# Patient Record
Sex: Female | Born: 1990 | Race: Black or African American | Hispanic: No | Marital: Single | Smoking: Never smoker
Health system: Southern US, Community
[De-identification: ages and names within clinical notes are randomized; demographics above are authoritative.]

## PROBLEM LIST (undated history)

## (undated) DIAGNOSIS — D219 Benign neoplasm of connective and other soft tissue, unspecified: Secondary | ICD-10-CM

## (undated) DIAGNOSIS — D649 Anemia, unspecified: Secondary | ICD-10-CM

## (undated) DIAGNOSIS — O99345 Other mental disorders complicating the puerperium: Secondary | ICD-10-CM

## (undated) DIAGNOSIS — F53 Postpartum depression: Secondary | ICD-10-CM

## (undated) DIAGNOSIS — Z789 Other specified health status: Secondary | ICD-10-CM

## (undated) HISTORY — PX: NO PAST SURGERIES: SHX2092

## (undated) HISTORY — DX: Postpartum depression: F53.0

## (undated) HISTORY — DX: Anemia, unspecified: D64.9

## (undated) HISTORY — DX: Other mental disorders complicating the puerperium: O99.345

## (undated) HISTORY — DX: Benign neoplasm of connective and other soft tissue, unspecified: D21.9

## (undated) HISTORY — PX: TOOTH EXTRACTION: SUR596

---
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
## 2009-04-22 ENCOUNTER — Emergency Department (HOSPITAL_COMMUNITY): Admission: EM | Admit: 2009-04-22 | Discharge: 2009-04-22 | Payer: Self-pay | Admitting: Emergency Medicine

## 2009-10-15 ENCOUNTER — Inpatient Hospital Stay (HOSPITAL_COMMUNITY): Admission: AD | Admit: 2009-10-15 | Discharge: 2009-10-15 | Payer: Self-pay | Admitting: Obstetrics & Gynecology

## 2009-10-15 ENCOUNTER — Encounter: Payer: Self-pay | Admitting: Family Medicine

## 2009-12-10 ENCOUNTER — Inpatient Hospital Stay (HOSPITAL_COMMUNITY): Admission: AD | Admit: 2009-12-10 | Discharge: 2009-12-10 | Payer: Self-pay | Admitting: Obstetrics & Gynecology

## 2010-01-13 ENCOUNTER — Ambulatory Visit (HOSPITAL_COMMUNITY): Admission: RE | Admit: 2010-01-13 | Discharge: 2010-01-13 | Payer: Self-pay | Admitting: Obstetrics & Gynecology

## 2010-02-10 ENCOUNTER — Inpatient Hospital Stay (HOSPITAL_COMMUNITY): Admission: AD | Admit: 2010-02-10 | Discharge: 2010-02-10 | Payer: Self-pay | Admitting: Obstetrics and Gynecology

## 2010-02-10 ENCOUNTER — Ambulatory Visit: Payer: Self-pay | Admitting: Advanced Practice Midwife

## 2010-09-26 NOTE — L&D Delivery Note (Signed)
Delivery Note At  a viable female was delivered via LOA (Presentation: ;  ).  APGAR:8+9, ; weight .   Placenta spont via shultz, .3vessel Cord:  Without complications.  Anesthesia:  Epidural Episiotomy: none Lacerations:none  Suture Repair: none Est. Blood Loss (mL):   Mom to postpartum.  Baby to nursery-stable.  Zerita Boers 05/22/2011, 3:09 PM

## 2010-12-08 ENCOUNTER — Inpatient Hospital Stay (HOSPITAL_COMMUNITY): Payer: Medicaid Other

## 2010-12-08 ENCOUNTER — Inpatient Hospital Stay (HOSPITAL_COMMUNITY)
Admission: AD | Admit: 2010-12-08 | Discharge: 2010-12-08 | Disposition: A | Discharge status: Home / Self Care | Payer: Medicaid Other | Source: Ambulatory Visit | Attending: Family Medicine | Admitting: Family Medicine

## 2010-12-08 DIAGNOSIS — R109 Unspecified abdominal pain: Secondary | ICD-10-CM

## 2010-12-08 DIAGNOSIS — O209 Hemorrhage in early pregnancy, unspecified: Secondary | ICD-10-CM

## 2010-12-08 LAB — CBC
HCT: 29.8 % — ABNORMAL LOW (ref 36.0–46.0)
Hemoglobin: 9.6 g/dL — ABNORMAL LOW (ref 12.0–15.0)
RBC: 4.23 MIL/uL (ref 3.87–5.11)
WBC: 5.1 10*3/uL (ref 4.0–10.5)

## 2010-12-08 LAB — URINALYSIS, ROUTINE W REFLEX MICROSCOPIC
Glucose, UA: NEGATIVE mg/dL
Specific Gravity, Urine: >1.03 — ABNORMAL HIGH (ref 1.005–1.030)
pH: 6.5 (ref 5.0–8.0)

## 2010-12-08 LAB — ABO/RH: ABO/RH(D): A POS

## 2010-12-08 LAB — HCG, QUANTITATIVE, PREGNANCY: hCG, Beta Chain, Quant, S: 30513 m[IU]/mL — ABNORMAL HIGH (ref <5)

## 2010-12-08 LAB — WET PREP, GENITAL
Clue Cells Wet Prep HPF POC: NONE SEEN
Trich, Wet Prep: NONE SEEN

## 2010-12-12 LAB — COMPREHENSIVE METABOLIC PANEL
ALT: 13 U/L (ref 0–35)
AST: 17 U/L (ref 0–37)
Albumin: 4 g/dL (ref 3.5–5.2)
Alkaline Phosphatase: 54 U/L (ref 39–117)
CO2: 24 mEq/L (ref 19–32)
Chloride: 102 mEq/L (ref 96–112)
Creatinine, Ser: 0.62 mg/dL (ref 0.4–1.2)
GFR calc Af Amer: >60 mL/min (ref >60)
GFR calc non Af Amer: >60 mL/min (ref >60)
Potassium: 4 mEq/L (ref 3.5–5.1)
Total Bilirubin: 0.5 mg/dL (ref 0.3–1.2)

## 2010-12-12 LAB — CBC
HCT: 34.8 % — ABNORMAL LOW (ref 36.0–46.0)
Hemoglobin: 11.1 g/dL — ABNORMAL LOW (ref 12.0–15.0)
MCHC: 32 g/dL (ref 30.0–36.0)
RBC: 4.77 MIL/uL (ref 3.87–5.11)
RDW: 15.2 % (ref 11.5–15.5)

## 2010-12-12 LAB — WET PREP, GENITAL: Yeast Wet Prep HPF POC: NONE SEEN

## 2010-12-12 LAB — POCT PREGNANCY, URINE: Preg Test, Ur: POSITIVE

## 2010-12-12 LAB — URINALYSIS, ROUTINE W REFLEX MICROSCOPIC
Glucose, UA: NEGATIVE mg/dL
Hgb urine dipstick: NEGATIVE
Ketones, ur: NEGATIVE mg/dL
Protein, ur: NEGATIVE mg/dL
pH: 6 (ref 5.0–8.0)

## 2010-12-13 LAB — URINE MICROSCOPIC-ADD ON

## 2010-12-13 LAB — URINALYSIS, ROUTINE W REFLEX MICROSCOPIC
Bilirubin Urine: NEGATIVE
Hgb urine dipstick: NEGATIVE
Specific Gravity, Urine: 1.025 (ref 1.005–1.030)
pH: 6.5 (ref 5.0–8.0)

## 2010-12-19 LAB — WET PREP, GENITAL

## 2010-12-19 LAB — URINALYSIS, ROUTINE W REFLEX MICROSCOPIC
Nitrite: NEGATIVE
Specific Gravity, Urine: 1.015 (ref 1.005–1.030)
Urobilinogen, UA: 0.2 mg/dL (ref 0.0–1.0)

## 2010-12-19 LAB — GC/CHLAMYDIA PROBE AMP, GENITAL
Chlamydia, DNA Probe: NEGATIVE
GC Probe Amp, Genital: NEGATIVE

## 2011-01-07 ENCOUNTER — Inpatient Hospital Stay (HOSPITAL_COMMUNITY)
Admission: AD | Admit: 2011-01-07 | Discharge: 2011-01-07 | Disposition: A | Discharge status: Home / Self Care | Payer: Medicaid Other | Source: Ambulatory Visit | Attending: Obstetrics & Gynecology | Admitting: Obstetrics & Gynecology

## 2011-01-07 ENCOUNTER — Inpatient Hospital Stay (HOSPITAL_COMMUNITY): Payer: Medicaid Other

## 2011-01-07 DIAGNOSIS — O99891 Other specified diseases and conditions complicating pregnancy: Secondary | ICD-10-CM | POA: Insufficient documentation

## 2011-01-07 DIAGNOSIS — R51 Headache: Secondary | ICD-10-CM | POA: Insufficient documentation

## 2011-01-07 DIAGNOSIS — O9989 Other specified diseases and conditions complicating pregnancy, childbirth and the puerperium: Secondary | ICD-10-CM

## 2011-01-17 ENCOUNTER — Inpatient Hospital Stay (HOSPITAL_COMMUNITY)
Admission: AD | Admit: 2011-01-17 | Discharge: 2011-01-18 | Disposition: A | Discharge status: Home / Self Care | Payer: Medicaid Other | Source: Ambulatory Visit | Attending: Family Medicine | Admitting: Family Medicine

## 2011-01-17 DIAGNOSIS — O4410 Placenta previa with hemorrhage, unspecified trimester: Secondary | ICD-10-CM | POA: Insufficient documentation

## 2011-01-18 ENCOUNTER — Inpatient Hospital Stay (HOSPITAL_COMMUNITY): Payer: Medicaid Other

## 2011-01-20 DIAGNOSIS — O4400 Placenta previa specified as without hemorrhage, unspecified trimester: Secondary | ICD-10-CM

## 2011-01-20 DIAGNOSIS — Z331 Pregnant state, incidental: Secondary | ICD-10-CM

## 2011-01-20 LAB — HEPATITIS B SURFACE ANTIGEN: Hepatitis B Surface Ag: NEGATIVE

## 2011-01-20 LAB — HIV ANTIBODY (ROUTINE TESTING W REFLEX): HIV: NONREACTIVE

## 2011-01-20 LAB — ANTIBODY SCREEN: Antibody Screen: NEGATIVE

## 2011-02-05 IMAGING — US US OB COMP LESS 14 WK
1 series · 14 of 28 positions shown · non-contrast
Comparison: none

OBSTETRICAL ULTRASOUND:
 This ultrasound exam was performed in the [HOSPITAL] Ultrasound Department.  The OB US report was generated in the AS system, and faxed to the ordering physician.  This report is also available in [HOSPITAL]?s Access[employer] and in [REDACTED] PACS.

[Series 1: us ob comp less 14 wks · 0.21mm/px · 29 acquisitions, 14 frames shown]
[im 2/29]
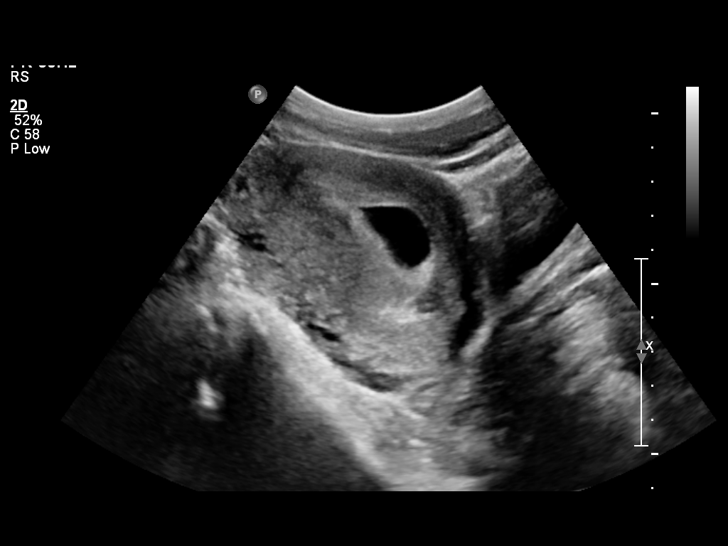
[im 4/29]
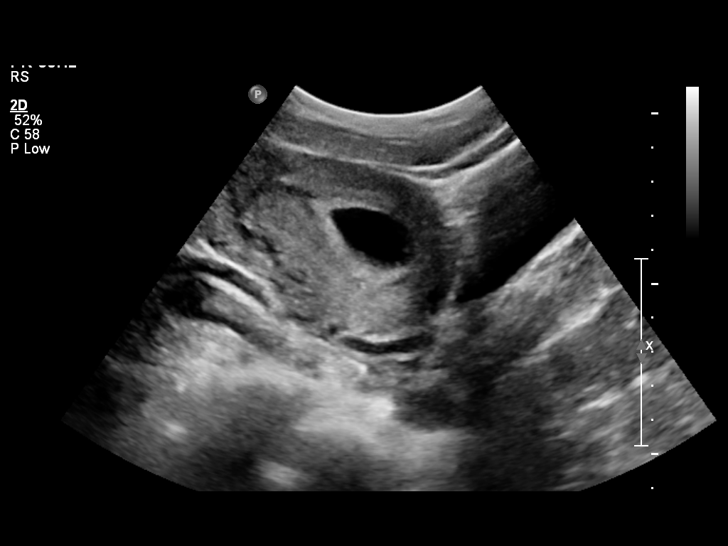
[im 6/29]
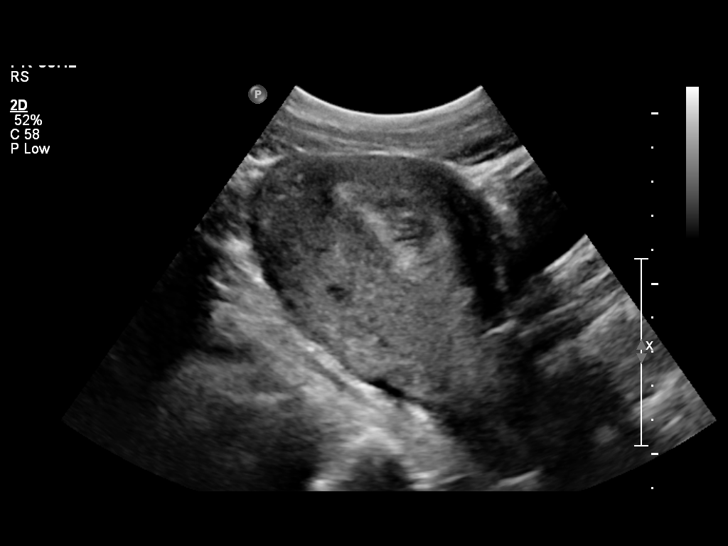
[im 8/29]
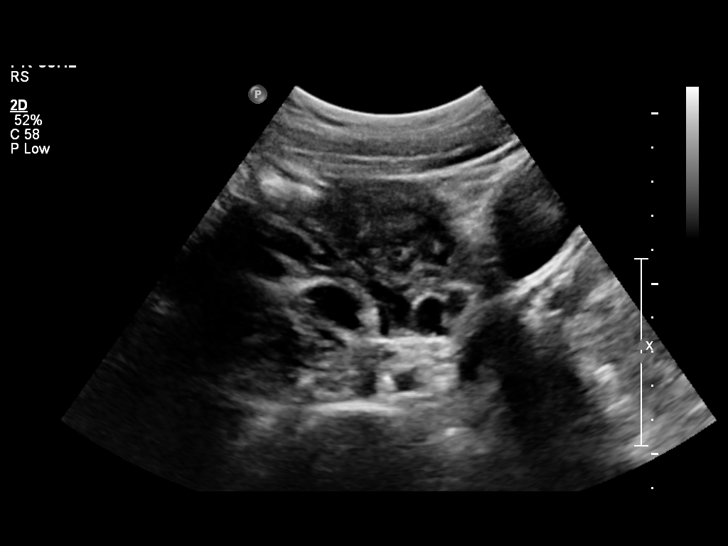
[im 10/29]
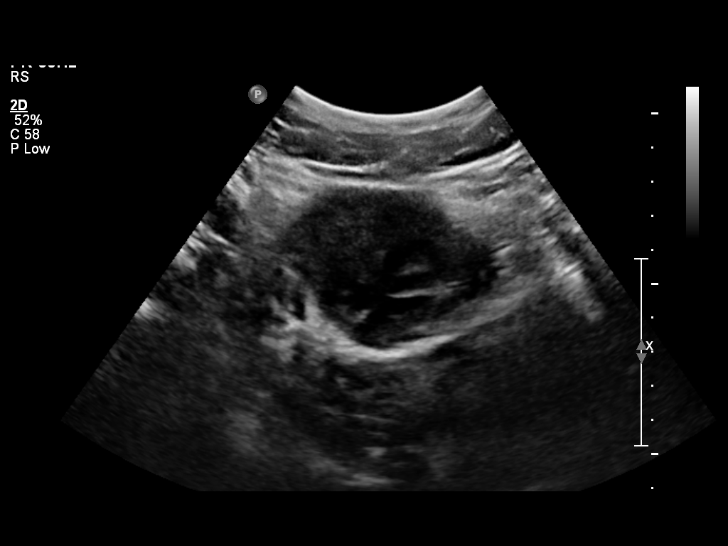
[im 12/29]
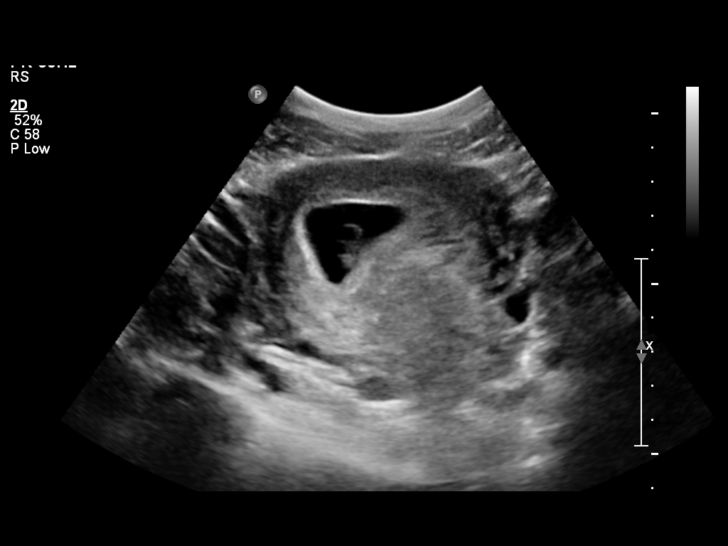
[im 14/29]
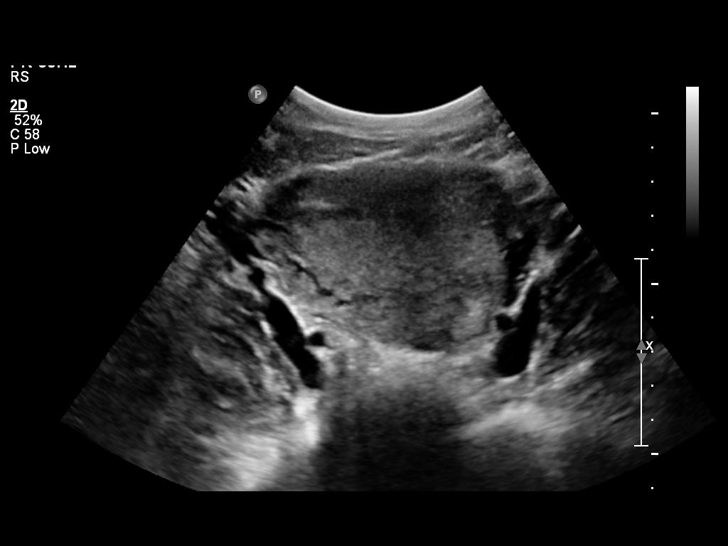
[im 16/29]
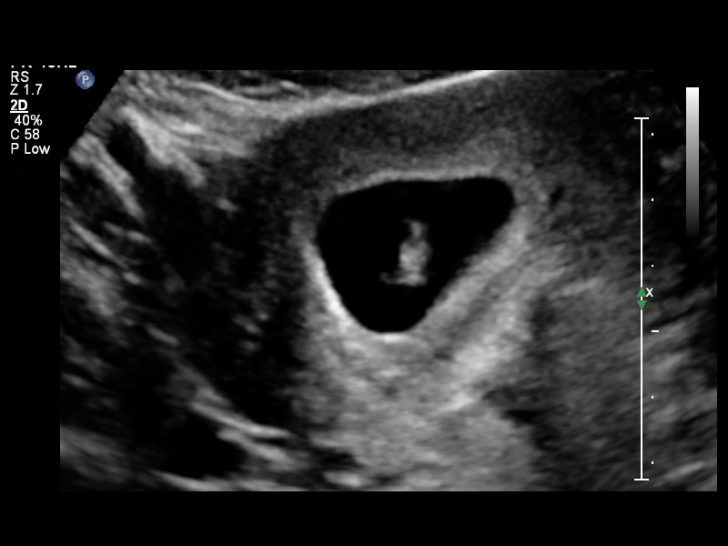
[im 18/29]
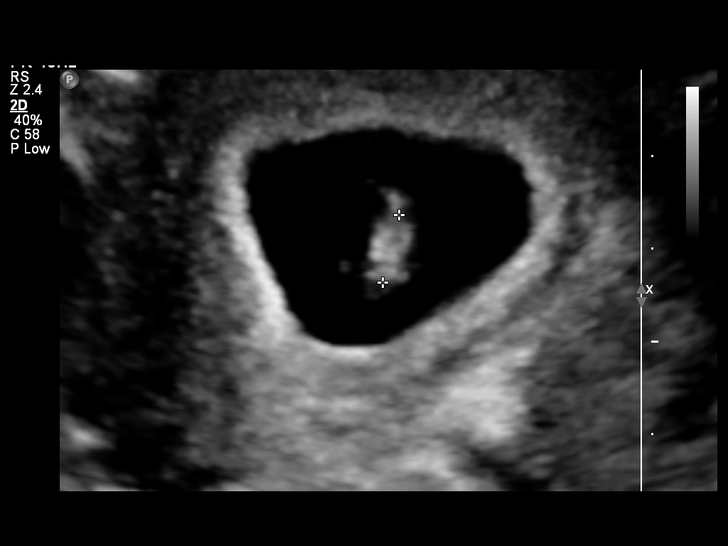
[im 20/29]
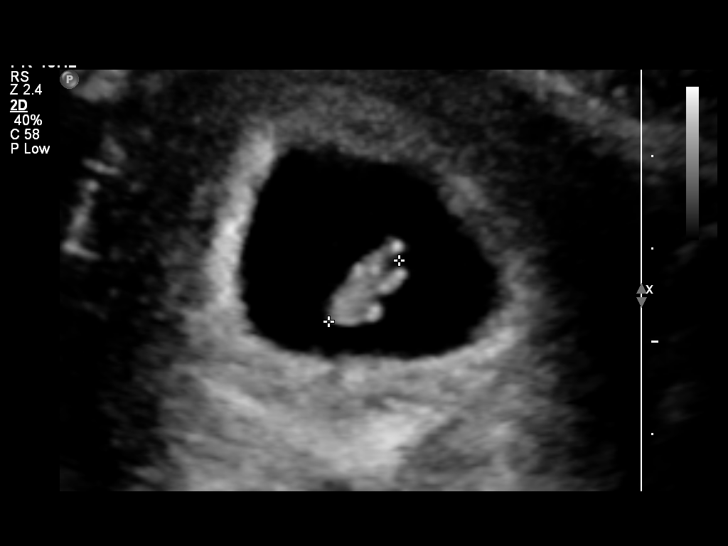
[im 22/29]
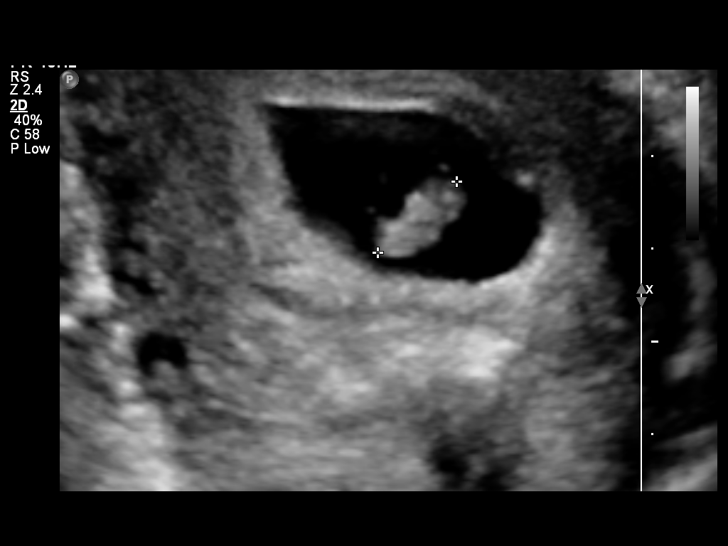
[im 24/29]
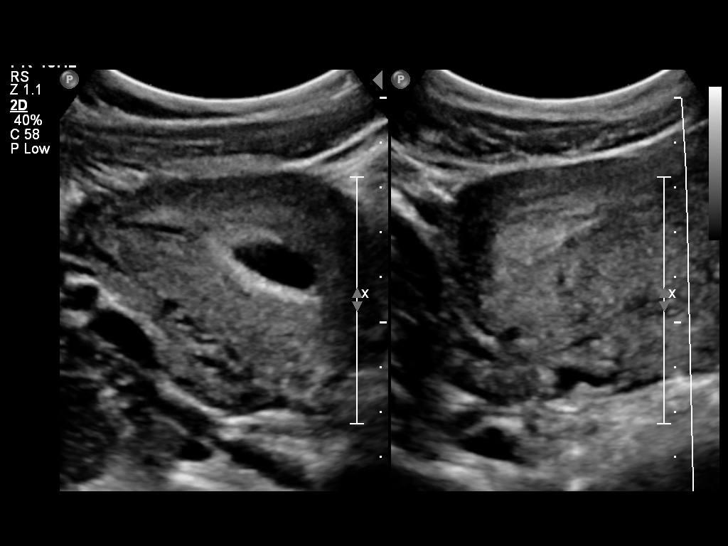
[im 26/29]
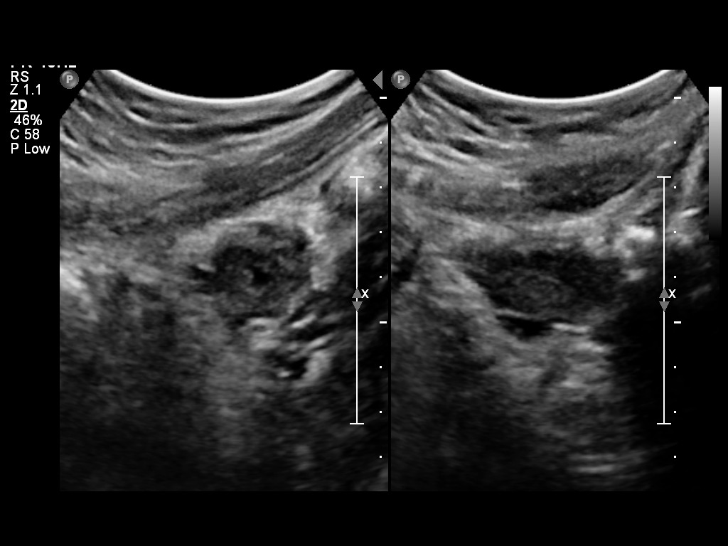
[im 29/29]
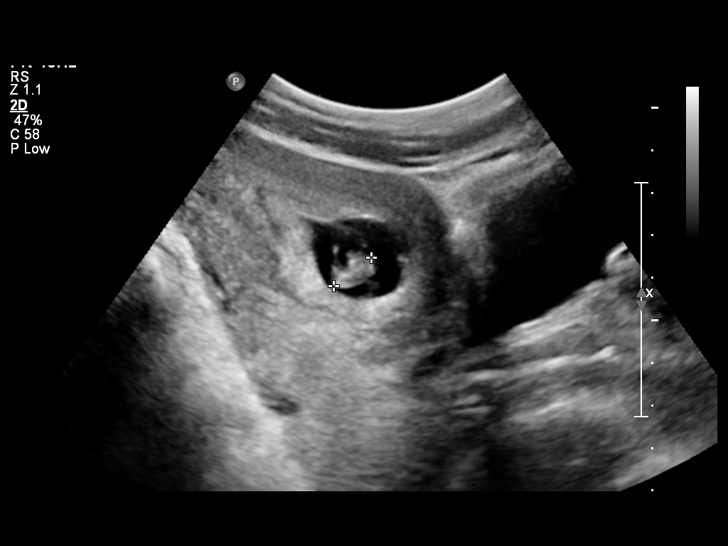

[14 of 28 positions shown; findings below may reference images not displayed]

IMPRESSION: See AS Obstetric US report.

## 2011-02-10 ENCOUNTER — Other Ambulatory Visit: Payer: Self-pay | Admitting: Obstetrics & Gynecology

## 2011-02-10 DIAGNOSIS — O4400 Placenta previa specified as without hemorrhage, unspecified trimester: Secondary | ICD-10-CM

## 2011-02-10 DIAGNOSIS — Z331 Pregnant state, incidental: Secondary | ICD-10-CM

## 2011-02-10 LAB — POCT URINALYSIS DIP (DEVICE)
Bilirubin Urine: NEGATIVE
Glucose, UA: NEGATIVE mg/dL
Hgb urine dipstick: NEGATIVE
Nitrite: NEGATIVE
Specific Gravity, Urine: 1.025 (ref 1.005–1.030)

## 2011-03-17 ENCOUNTER — Other Ambulatory Visit: Payer: Self-pay | Admitting: Family Medicine

## 2011-03-17 ENCOUNTER — Ambulatory Visit (HOSPITAL_COMMUNITY)
Admission: RE | Admit: 2011-03-17 | Discharge: 2011-03-17 | Disposition: A | Discharge status: Home / Self Care | Payer: Medicaid Other | Source: Ambulatory Visit | Attending: Obstetrics & Gynecology | Admitting: Obstetrics & Gynecology

## 2011-03-17 ENCOUNTER — Other Ambulatory Visit: Payer: Self-pay | Admitting: Obstetrics & Gynecology

## 2011-03-17 DIAGNOSIS — O4400 Placenta previa specified as without hemorrhage, unspecified trimester: Secondary | ICD-10-CM

## 2011-03-17 DIAGNOSIS — Z3689 Encounter for other specified antenatal screening: Secondary | ICD-10-CM | POA: Insufficient documentation

## 2011-03-17 LAB — POCT URINALYSIS DIP (DEVICE)
Bilirubin Urine: NEGATIVE
Glucose, UA: NEGATIVE mg/dL
Ketones, ur: NEGATIVE mg/dL
pH: 7 (ref 5.0–8.0)

## 2011-03-31 ENCOUNTER — Other Ambulatory Visit: Payer: Self-pay | Admitting: Obstetrics and Gynecology

## 2011-03-31 DIAGNOSIS — O99019 Anemia complicating pregnancy, unspecified trimester: Secondary | ICD-10-CM

## 2011-03-31 LAB — POCT URINALYSIS DIP (DEVICE)
Bilirubin Urine: NEGATIVE
Glucose, UA: NEGATIVE mg/dL
Hgb urine dipstick: NEGATIVE
Ketones, ur: NEGATIVE mg/dL
Leukocytes, UA: NEGATIVE
Nitrite: NEGATIVE
Protein, ur: NEGATIVE mg/dL
Specific Gravity, Urine: 1.025 (ref 1.005–1.030)
Urobilinogen, UA: 1 mg/dL (ref 0.0–1.0)
pH: 7 (ref 5.0–8.0)

## 2011-04-20 DIAGNOSIS — O99019 Anemia complicating pregnancy, unspecified trimester: Secondary | ICD-10-CM

## 2011-04-20 DIAGNOSIS — O4400 Placenta previa specified as without hemorrhage, unspecified trimester: Secondary | ICD-10-CM

## 2011-05-04 ENCOUNTER — Other Ambulatory Visit: Payer: Self-pay | Admitting: Obstetrics and Gynecology

## 2011-05-04 LAB — POCT URINALYSIS DIP (DEVICE)
Bilirubin Urine: NEGATIVE
Glucose, UA: NEGATIVE mg/dL
Hgb urine dipstick: NEGATIVE
Ketones, ur: NEGATIVE mg/dL
Specific Gravity, Urine: 1.02 (ref 1.005–1.030)
Urobilinogen, UA: 1 mg/dL (ref 0.0–1.0)

## 2011-05-05 LAB — GC/CHLAMYDIA PROBE AMP, GENITAL
Chlamydia, DNA Probe: NEGATIVE
GC Probe Amp, Genital: NEGATIVE

## 2011-05-06 LAB — STREP B DNA PROBE: GBSP: POSITIVE

## 2011-05-11 ENCOUNTER — Other Ambulatory Visit: Payer: Self-pay | Admitting: Family Medicine

## 2011-05-11 DIAGNOSIS — Z3480 Encounter for supervision of other normal pregnancy, unspecified trimester: Secondary | ICD-10-CM

## 2011-05-11 LAB — POCT URINALYSIS DIP (DEVICE)
Bilirubin Urine: NEGATIVE
Glucose, UA: NEGATIVE mg/dL
Ketones, ur: NEGATIVE mg/dL
Leukocytes, UA: NEGATIVE
Nitrite: NEGATIVE

## 2011-05-18 ENCOUNTER — Ambulatory Visit (INDEPENDENT_AMBULATORY_CARE_PROVIDER_SITE_OTHER): Payer: Medicaid Other | Admitting: Obstetrics and Gynecology

## 2011-05-18 DIAGNOSIS — O99019 Anemia complicating pregnancy, unspecified trimester: Secondary | ICD-10-CM

## 2011-05-18 DIAGNOSIS — Z3480 Encounter for supervision of other normal pregnancy, unspecified trimester: Secondary | ICD-10-CM

## 2011-05-18 DIAGNOSIS — O2390 Unspecified genitourinary tract infection in pregnancy, unspecified trimester: Secondary | ICD-10-CM

## 2011-05-18 LAB — POCT URINALYSIS DIP (DEVICE)
Bilirubin Urine: NEGATIVE
Glucose, UA: NEGATIVE mg/dL
Ketones, ur: NEGATIVE mg/dL
Leukocytes, UA: NEGATIVE
Specific Gravity, Urine: 1.02 (ref 1.005–1.030)

## 2011-05-22 ENCOUNTER — Inpatient Hospital Stay (HOSPITAL_COMMUNITY)
Admission: AD | Admit: 2011-05-22 | Discharge: 2011-05-24 | DRG: 775 | Disposition: A | Discharge status: Home / Self Care | Payer: Medicaid Other | Source: Ambulatory Visit | Attending: Obstetrics and Gynecology | Admitting: Obstetrics and Gynecology

## 2011-05-22 ENCOUNTER — Encounter (HOSPITAL_COMMUNITY): Payer: Self-pay | Admitting: Anesthesiology

## 2011-05-22 ENCOUNTER — Inpatient Hospital Stay (HOSPITAL_COMMUNITY): Payer: Medicaid Other | Admitting: Anesthesiology

## 2011-05-22 ENCOUNTER — Encounter (HOSPITAL_COMMUNITY): Payer: Self-pay

## 2011-05-22 DIAGNOSIS — Z22330 Carrier of Group B streptococcus: Secondary | ICD-10-CM

## 2011-05-22 DIAGNOSIS — O99892 Other specified diseases and conditions complicating childbirth: Principal | ICD-10-CM | POA: Diagnosis present

## 2011-05-22 DIAGNOSIS — O9989 Other specified diseases and conditions complicating pregnancy, childbirth and the puerperium: Secondary | ICD-10-CM

## 2011-05-22 HISTORY — DX: Other specified health status: Z78.9

## 2011-05-22 LAB — CBC
HCT: 29.9 % — ABNORMAL LOW (ref 36.0–46.0)
Hemoglobin: 9.7 g/dL — ABNORMAL LOW (ref 12.0–15.0)
MCH: 22.6 pg — ABNORMAL LOW (ref 26.0–34.0)
MCV: 69.7 fL — ABNORMAL LOW (ref 78.0–100.0)
RBC: 4.29 MIL/uL (ref 3.87–5.11)

## 2011-05-22 MED ORDER — WITCH HAZEL-GLYCERIN EX PADS: 1.0000 "application " | MEDICATED_PAD | CUTANEOUS | Status: DC | PRN

## 2011-05-22 MED ORDER — DIPHENHYDRAMINE HCL 50 MG/ML IJ SOLN: 12.5000 mg | INTRAMUSCULAR | Status: DC | PRN

## 2011-05-22 MED ORDER — CITRIC ACID-SODIUM CITRATE 334-500 MG/5ML PO SOLN: 30.0000 mL | ORAL | Status: DC | PRN

## 2011-05-22 MED ORDER — OXYTOCIN BOLUS FROM INFUSION
500.0000 mL | Freq: Once | INTRAVENOUS | Status: DC
  Filled 2011-05-22: qty 500

## 2011-05-22 MED ORDER — TETANUS-DIPHTH-ACELL PERTUSSIS 5-2.5-18.5 LF-MCG/0.5 IM SUSP
0.5000 mL | Freq: Once | INTRAMUSCULAR | Status: AC
  Administered 2011-05-23: 0.5 mL via INTRAMUSCULAR
  Filled 2011-05-22: qty 0.5

## 2011-05-22 MED ORDER — LACTATED RINGERS IV SOLN
500.0000 mL | INTRAVENOUS | Status: DC | PRN
  Administered 2011-05-22: 500 mL via INTRAVENOUS

## 2011-05-22 MED ORDER — OXYTOCIN 20 UNITS IN LACTATED RINGERS INFUSION - SIMPLE
1.0000 m[IU]/min | INTRAVENOUS | Status: DC
  Administered 2011-05-22: 2 m[IU]/min via INTRAVENOUS
  Administered 2011-05-22: 4 m[IU]/min via INTRAVENOUS
  Administered 2011-05-22: 6 m[IU]/min via INTRAVENOUS

## 2011-05-22 MED ORDER — FENTANYL 2.5 MCG/ML BUPIVACAINE 1/10 % EPIDURAL INFUSION (WH - ANES)
14.0000 mL/h | INTRAMUSCULAR | Status: DC
  Administered 2011-05-22: 14 mL/h via EPIDURAL
  Filled 2011-05-22: qty 60

## 2011-05-22 MED ORDER — BENZOCAINE-MENTHOL 20-0.5 % EX AERO: 1.0000 "application " | INHALATION_SPRAY | CUTANEOUS | Status: DC | PRN

## 2011-05-22 MED ORDER — LACTATED RINGERS IV SOLN: 500.0000 mL | Freq: Once | INTRAVENOUS | Status: DC

## 2011-05-22 MED ORDER — PHENYLEPHRINE 40 MCG/ML (10ML) SYRINGE FOR IV PUSH (FOR BLOOD PRESSURE SUPPORT)
80.0000 ug | PREFILLED_SYRINGE | INTRAVENOUS | Status: DC | PRN
  Filled 2011-05-22 (×2): qty 5

## 2011-05-22 MED ORDER — ONDANSETRON HCL 4 MG PO TABS: 4.0000 mg | ORAL_TABLET | ORAL | Status: DC | PRN

## 2011-05-22 MED ORDER — PRENATAL PLUS 27-1 MG PO TABS
1.0000 | ORAL_TABLET | Freq: Every day | ORAL | Status: DC
  Administered 2011-05-23 – 2011-05-24 (×2): 1 via ORAL
  Filled 2011-05-22 (×2): qty 1

## 2011-05-22 MED ORDER — ONDANSETRON HCL 4 MG/2ML IJ SOLN: 4.0000 mg | Freq: Four times a day (QID) | INTRAMUSCULAR | Status: DC | PRN

## 2011-05-22 MED ORDER — PHENYLEPHRINE 40 MCG/ML (10ML) SYRINGE FOR IV PUSH (FOR BLOOD PRESSURE SUPPORT)
80.0000 ug | PREFILLED_SYRINGE | INTRAVENOUS | Status: DC | PRN
  Filled 2011-05-22: qty 5

## 2011-05-22 MED ORDER — IBUPROFEN 600 MG PO TABS: 600.0000 mg | ORAL_TABLET | Freq: Four times a day (QID) | ORAL | Status: DC | PRN

## 2011-05-22 MED ORDER — SIMETHICONE 80 MG PO CHEW: 80.0000 mg | CHEWABLE_TABLET | ORAL | Status: DC | PRN

## 2011-05-22 MED ORDER — TERBUTALINE SULFATE 1 MG/ML IJ SOLN: 0.2500 mg | Freq: Once | INTRAMUSCULAR | Status: DC | PRN

## 2011-05-22 MED ORDER — LACTATED RINGERS IV SOLN
INTRAVENOUS | Status: DC
  Administered 2011-05-22: 125 mL/h via INTRAVENOUS

## 2011-05-22 MED ORDER — LIDOCAINE HCL (PF) 1 % IJ SOLN: 30.0000 mL | INTRAMUSCULAR | Status: DC | PRN

## 2011-05-22 MED ORDER — SENNOSIDES-DOCUSATE SODIUM 8.6-50 MG PO TABS
2.0000 | ORAL_TABLET | Freq: Every day | ORAL | Status: DC
  Administered 2011-05-22 – 2011-05-23 (×2): 2 via ORAL

## 2011-05-22 MED ORDER — EPHEDRINE 5 MG/ML INJ
10.0000 mg | INTRAVENOUS | Status: DC | PRN
  Filled 2011-05-22 (×2): qty 4

## 2011-05-22 MED ORDER — OXYCODONE-ACETAMINOPHEN 5-325 MG PO TABS
1.0000 | ORAL_TABLET | ORAL | Status: DC | PRN
  Administered 2011-05-23 (×4): 1 via ORAL
  Filled 2011-05-22 (×4): qty 1

## 2011-05-22 MED ORDER — DIBUCAINE 1 % RE OINT: 1.0000 "application " | TOPICAL_OINTMENT | RECTAL | Status: DC | PRN

## 2011-05-22 MED ORDER — SODIUM CHLORIDE 0.9 % IV SOLN
2.0000 g | Freq: Four times a day (QID) | INTRAVENOUS | Status: DC
  Filled 2011-05-22 (×2): qty 2000

## 2011-05-22 MED ORDER — OXYCODONE-ACETAMINOPHEN 5-325 MG PO TABS: 2.0000 | ORAL_TABLET | ORAL | Status: DC | PRN

## 2011-05-22 MED ORDER — IBUPROFEN 600 MG PO TABS
600.0000 mg | ORAL_TABLET | Freq: Four times a day (QID) | ORAL | Status: DC
  Administered 2011-05-22 – 2011-05-24 (×8): 600 mg via ORAL
  Filled 2011-05-22 (×8): qty 1

## 2011-05-22 MED ORDER — DIPHENHYDRAMINE HCL 25 MG PO CAPS: 25.0000 mg | ORAL_CAPSULE | Freq: Four times a day (QID) | ORAL | Status: DC | PRN

## 2011-05-22 MED ORDER — OXYTOCIN 20 UNITS IN LACTATED RINGERS INFUSION - SIMPLE
125.0000 mL/h | INTRAVENOUS | Status: AC
  Administered 2011-05-22: 125 mL/h via INTRAVENOUS
  Filled 2011-05-22: qty 1000

## 2011-05-22 MED ORDER — ZOLPIDEM TARTRATE 5 MG PO TABS: 5.0000 mg | ORAL_TABLET | Freq: Every evening | ORAL | Status: DC | PRN

## 2011-05-22 MED ORDER — SODIUM CHLORIDE 0.9 % IV SOLN
2.0000 g | Freq: Once | INTRAVENOUS | Status: DC
  Administered 2011-05-22: 2 g via INTRAVENOUS
  Filled 2011-05-22: qty 2000

## 2011-05-22 MED ORDER — LANOLIN HYDROUS EX OINT: TOPICAL_OINTMENT | CUTANEOUS | Status: DC | PRN

## 2011-05-22 MED ORDER — ACETAMINOPHEN 325 MG PO TABS: 650.0000 mg | ORAL_TABLET | ORAL | Status: DC | PRN

## 2011-05-22 MED ORDER — FLEET ENEMA 7-19 GM/118ML RE ENEM: 1.0000 | ENEMA | RECTAL | Status: DC | PRN

## 2011-05-22 MED ORDER — EPHEDRINE 5 MG/ML INJ
10.0000 mg | INTRAVENOUS | Status: DC | PRN
  Filled 2011-05-22: qty 4

## 2011-05-22 MED ORDER — ONDANSETRON HCL 4 MG/2ML IJ SOLN: 4.0000 mg | INTRAMUSCULAR | Status: DC | PRN

## 2011-05-22 NOTE — Progress Notes (Signed)
Spontaneous vaginal delivery of viable female at 1458 apgars 8 and 9 see delivery room

## 2011-05-22 NOTE — Progress Notes (Signed)
Attempted to get pt up with steady pt unable to stand but can move legs. Pericare given after scan and clean pad and ice pack applied

## 2011-05-22 NOTE — Progress Notes (Signed)
Sheila Dudley is a 20 y.o. G3P2001 at [redacted]w[redacted]d by ultrasound admitted for rupture of membranes  Subjective:   Objective: BP 109/68  Pulse 78  Temp(Src) 98.1 F (36.7 C) (Oral)  Resp 18  Ht 5\' 5"  (1.651 m)  Wt 79.833 kg (176 lb)  BMI 29.29 kg/m2      FHT:  FHR: 120's bpm, variability: moderate,  accelerations:  Present,  decelerations:  Absent UC:   regular, every 2-4 minutes SVE:   Dilation: 2 Effacement (%): 60 Station: -2 Exam by:: K.Wilson,RN  Labs: Lab Results  Component Value Date   WBC 5.8 05/22/2011   HGB 9.7* 05/22/2011   HCT 29.9* 05/22/2011   MCV 69.7* 05/22/2011   PLT 250 05/22/2011    Assessment / Plan: Augmentation of labor, progressing well  Labor: Progressing normally Preeclampsia:  no signs or symptoms of toxicity Fetal Wellbeing:  Category I Pain Control:  Labor support without medications I/D:   Anticipated MOD:  NSVD  Zerita Boers 05/22/2011, 1:34 PM

## 2011-05-22 NOTE — Anesthesia Procedure Notes (Signed)
Epidural Patient location during procedure: OB Start time: 05/22/2011 12:37 PM  Staffing Anesthesiologist: Jiles Garter  Preanesthetic Checklist Completed: patient identified, site marked, surgical consent, pre-op evaluation, timeout performed, IV checked, risks and benefits discussed and monitors and equipment checked  Epidural Patient position: sitting Prep: site prepped and draped and DuraPrep Patient monitoring: continuous pulse ox and blood pressure Approach: midline Injection technique: LOR air  Needle:  Needle type: Tuohy  Needle gauge: 17 G Needle length: 9 cm Needle insertion depth: 4 cm Catheter type: closed end flexible Catheter size: 19 Gauge Catheter at skin depth: 10 cm Test dose: negative  Assessment Events: blood not aspirated, injection not painful, no injection resistance, negative IV test and no paresthesia  Additional Notes Dosing of Epidural: 1st dose, Through needle...... 5mg  Marcaine 2nd dose, through catheter.... epi 1:200K + Xylocaine 40 mg 3rd dose, through catheter...Marland KitchenMarland Kitchenepi 1:200K + Xylocaine 60 mg Each dose occurred after waiting 3 min,patient was free of IV sx; and patient exhibits no evidence of SA injection  Patient is more comfortable after epidural dosed. Please see RN's note for documentation of vital signs,and FHR which are stable.

## 2011-05-22 NOTE — Anesthesia Preprocedure Evaluation (Signed)

## 2011-05-22 NOTE — H&P (Signed)
Sheila Dudley is a 20 y.o. female presenting for SROM. Maternal Medical History:  Reason for admission: Reason for admission: rupture of membranes.  Contractions: Onset was 1-2 hours ago.   Frequency: rare.   Perceived severity is mild.    Fetal activity: Perceived fetal activity is normal.   Last perceived fetal movement was within the past hour.      OB History    Grav Para Term Preterm Abortions TAB SAB Ect Mult Living   3 2 2  0 0 0 0 0 0 1     Past Medical History  Diagnosis Date  . No pertinent past medical history    Past Surgical History  Procedure Date  . No past surgeries    Family History: family history is not on file. Social History:  reports that she has never smoked. She does not have any smokeless tobacco history on file. She reports that she does not drink alcohol or use illicit drugs.  Review of Systems  Constitutional: Negative.   HENT: Negative.   Eyes: Negative.   Respiratory: Negative.   Cardiovascular: Negative.   Gastrointestinal: Negative.   Genitourinary: Negative.   Musculoskeletal: Negative.   Skin: Negative.   Neurological: Positive for dizziness.  Endo/Heme/Allergies: Negative.   Psychiatric/Behavioral: Negative.     Dilation: 2 Effacement (%): 60 Station: -2 Exam by:: K.Wilson,RN Blood pressure 119/81, pulse 84, temperature 98.4 F (36.9 C), temperature source Oral, resp. rate 16, height 5\' 5"  (1.651 m), weight 79.742 kg (175 lb 12.8 oz). Maternal Exam:  Uterine Assessment: Contraction strength is mild.  Contraction frequency is rare.   Abdomen: Patient reports no abdominal tenderness. Estimated fetal weight is 7-8 lbs.   Fetal presentation: vertex  Introitus: Normal vulva.   Physical Exam  Constitutional: She is oriented to person, place, and time. She appears well-developed and well-nourished.  HENT:  Head: Normocephalic.  Neck: Normal range of motion.  Cardiovascular: Normal rate, regular rhythm and normal heart sounds.    Respiratory: Effort normal and breath sounds normal.  GI: Soft.       nontender and gravid  Genitourinary: Vagina normal.  Musculoskeletal: Normal range of motion.  Neurological: She is alert and oriented to person, place, and time. She has normal reflexes.  Skin: Skin is warm and dry.  Psychiatric: She has a normal mood and affect. Her behavior is normal. Judgment and thought content normal.    Prenatal labs: ABO, Rh: A/Positive/-- (04/26 0000) Antibody: Negative (04/26 0000) Rubella:   RPR: Nonreactive (04/26 0000)  HBsAg: Negative (04/26 0000)  HIV: Non-reactive (04/26 0000)  GBS: POSITIVE (08/08 1302)   Assessment/Plan: Pos GBS with SROM. Will admit and start pit aug to facilitate labor after iv antibiotics. Stable maternal fetal unit.   Zerita Boers 05/22/2011, 9:47 AM

## 2011-05-22 NOTE — Progress Notes (Signed)
Patient presents with c/o when getting out of car had to urinate really badly felt leaking, felt leaking in toilet around 8:00 a.m., feels like still is leaking, EDC 9/10 G3P2

## 2011-05-22 NOTE — Progress Notes (Signed)
EFM removed for epidural proceedure

## 2011-05-22 NOTE — Progress Notes (Signed)
EFM applied.

## 2011-05-23 NOTE — Progress Notes (Signed)
Post Partum Day 1  Subjective: no complaints, up ad lib, voiding, tolerating PO and + flatus  Objective: Blood pressure 106/72, pulse 84, temperature 97 F (36.1 C), temperature source Oral, resp. rate 18, height 5\' 5"  (1.651 m), weight 79.833 kg (176 lb), SpO2 96.00%, unknown if currently breastfeeding.  Physical Exam:  General: alert, cooperative, appears stated age and no distress Lochia: appropriate Uterine Fundus: firm Incision:  DVT Evaluation: No evidence of DVT seen on physical exam. Negative Homan's sign. No cords or calf tenderness. No significant calf/ankle edema.   Basename 05/22/11 1015  HGB 9.7*  HCT 29.9*    Assessment/Plan: Plan for discharge tomorrow and Contraception implanon   LOS: 1 day   Zerita Boers 05/23/2011, 6:30 AM

## 2011-05-23 NOTE — Progress Notes (Signed)
UR chart review completed.  

## 2011-05-23 NOTE — Anesthesia Postprocedure Evaluation (Signed)
Anesthesia Post Note  Patient: Sheila Dudley  Procedure(s) Performed: * No procedures listed *  Anesthesia type: Spinal and Epidural  Patient location: Mother/Baby  Post pain: Pain level controlled  Post assessment: Post-op Vital signs reviewed and Patient's Cardiovascular Status Stable  Last Vitals:  Filed Vitals:   05/23/11 0545  BP: 106/72  Pulse: 84  Temp: 97 F (36.1 C)  Resp: 18    Post vital signs: Reviewed and stable  Level of consciousness: awake, alert  and oriented  Complications: No apparent anesthesia complications

## 2011-05-24 MED ORDER — IBUPROFEN 600 MG PO TABS: 600.0000 mg | ORAL_TABLET | Freq: Four times a day (QID) | ORAL | Status: AC

## 2011-05-24 MED ORDER — PRENATAL PLUS 27-1 MG PO TABS: 1.0000 | ORAL_TABLET | Freq: Every day | ORAL | Status: AC

## 2011-05-24 MED ORDER — OXYCODONE-ACETAMINOPHEN 5-325 MG PO TABS: 1.0000 | ORAL_TABLET | ORAL | Status: AC | PRN

## 2011-05-24 MED ORDER — DOCUSATE SODIUM 100 MG PO CAPS
100.0000 mg | ORAL_CAPSULE | Freq: Two times a day (BID) | ORAL | Status: AC | PRN
Start: 2011-05-24 — End: 2011-06-03

## 2011-05-24 MED ORDER — FERROUS SULFATE 325 (65 FE) MG PO TABS: 325.0000 mg | ORAL_TABLET | Freq: Three times a day (TID) | ORAL | Status: DC

## 2011-05-24 NOTE — Discharge Summary (Signed)
Obstetric Discharge Summary Reason for Admission: rupture of membranes Prenatal Procedures: ultrasound Intrapartum Procedures: spontaneous vaginal delivery Postpartum Procedures: none Complications-Operative and Postpartum: none   H/H: Lab Results  Component Value Date/Time   HGB 9.7* 05/22/2011 10:15 AM   HCT 29.9* 05/22/2011 10:15 AM      Discharge Diagnoses: Term Pregnancy-delivered  Discharge Information: Date: 04/07/2011 Activity: unrestricted and pelvic rest Diet: routine Medications: PNV, Ibuprophen, Colace, Iron and Percocet Breast feeding:  Yes Condition: stable Instructions: refer to practice specific booklet Discharge to: home   Dudley, Sheila Hermiz 05/24/2011,6:35 AM

## 2011-05-24 NOTE — Progress Notes (Signed)
  Post Partum Day 2 Subjective: no complaints, up ad lib, voiding and tolerating PO, moderate lochia, absent BM, absent flatus, plans to breastfeed, implanon  Objective: Blood pressure 106/70, pulse 81, temperature 98.4 F (36.9 C), temperature source Oral, resp. rate 18, height 5\' 5"  (1.651 m), weight 176 lb (79.833 kg), SpO2 96.00%, unknown if currently breastfeeding.  Physical Exam:  General: alert, cooperative, appears stated age and no distress Lochia: appropriate Chest: CTAB Heart: RRR no m/r/g Abdomen: +BS, soft, nontender,  Uterine Fundus: firm DVT Evaluation: No evidence of DVT seen on physical exam. Negative Homan's sign. No significant calf/ankle edema. Extremities:    Basename 05/22/11 1015  HGB 9.7*  HCT 29.9*    Assessment/Plan: Discharge home, Breastfeeding and Contraception implanon Will discharge with iron and colace.   LOS: 2 days   BOOTH, Nahsir Venezia 05/24/2011, 6:29 AM

## 2011-06-16 LAB — URINALYSIS, ROUTINE W REFLEX MICROSCOPIC
Glucose, UA: NEGATIVE
Hgb urine dipstick: NEGATIVE
Ketones, ur: NEGATIVE
Ketones, ur: NEGATIVE
Nitrite: NEGATIVE
Specific Gravity, Urine: 1.015
pH: 8
pH: 7.5

## 2011-06-16 LAB — WET PREP, GENITAL: Yeast Wet Prep HPF POC: NONE SEEN

## 2011-06-16 LAB — GC/CHLAMYDIA PROBE AMP, GENITAL: GC Probe Amp, Genital: NEGATIVE

## 2011-06-20 LAB — URINALYSIS, ROUTINE W REFLEX MICROSCOPIC
Glucose, UA: NEGATIVE
Hgb urine dipstick: NEGATIVE
pH: 7.5

## 2011-06-20 LAB — POCT URINALYSIS DIP (DEVICE)
Bilirubin Urine: NEGATIVE
Glucose, UA: NEGATIVE
Ketones, ur: NEGATIVE
Operator id: 297281
Specific Gravity, Urine: 1.015

## 2011-06-21 LAB — POCT URINALYSIS DIP (DEVICE)
Bilirubin Urine: NEGATIVE
Glucose, UA: NEGATIVE
Glucose, UA: NEGATIVE
Glucose, UA: NEGATIVE
Ketones, ur: NEGATIVE
Ketones, ur: NEGATIVE
Ketones, ur: NEGATIVE
Operator id: 159681
Operator id: 297281
Operator id: 297281
Protein, ur: NEGATIVE
Protein, ur: 30 — AB
Protein, ur: NEGATIVE
Urobilinogen, UA: 2 — ABNORMAL HIGH

## 2011-06-22 LAB — POCT URINALYSIS DIP (DEVICE)
Ketones, ur: NEGATIVE
Operator id: 120861
Operator id: 120861
Protein, ur: NEGATIVE
Protein, ur: NEGATIVE
Urobilinogen, UA: 1
Urobilinogen, UA: 1

## 2011-06-23 LAB — URINALYSIS, ROUTINE W REFLEX MICROSCOPIC
Bilirubin Urine: NEGATIVE
Ketones, ur: NEGATIVE
Ketones, ur: NEGATIVE
Nitrite: NEGATIVE
Nitrite: NEGATIVE
Nitrite: NEGATIVE
Protein, ur: NEGATIVE
Protein, ur: NEGATIVE
Specific Gravity, Urine: <1.005 — ABNORMAL LOW
Specific Gravity, Urine: 1.02
Urobilinogen, UA: 0.2
Urobilinogen, UA: 1
pH: 7

## 2011-06-23 LAB — COMPREHENSIVE METABOLIC PANEL
Albumin: 3 — ABNORMAL LOW
Alkaline Phosphatase: 163 — ABNORMAL HIGH
Alkaline Phosphatase: 173 — ABNORMAL HIGH
BUN: 6
BUN: 8
CO2: 24
Calcium: 8.9
Chloride: 101
Creatinine, Ser: 0.56
GFR calc non Af Amer: >60
Glucose, Bld: 78
Potassium: 4.3
Sodium: 133 — ABNORMAL LOW
Total Bilirubin: 0.5
Total Protein: 6.1

## 2011-06-23 LAB — POCT URINALYSIS DIP (DEVICE)
Bilirubin Urine: NEGATIVE
Glucose, UA: NEGATIVE
Ketones, ur: NEGATIVE
Operator id: 148111
Operator id: 148111
Protein, ur: 30 — AB
Protein, ur: NEGATIVE
Specific Gravity, Urine: 1.02
Urobilinogen, UA: 0.2

## 2011-06-23 LAB — CBC
HCT: 28.4 — ABNORMAL LOW
HCT: 30.8 — ABNORMAL LOW
Hemoglobin: 10.1 — ABNORMAL LOW
MCHC: 32.8
MCV: 70.7 — ABNORMAL LOW
Platelets: 265
Platelets: 270
RBC: 3.61 — ABNORMAL LOW
RBC: 4.36
RDW: 16.9 — ABNORMAL HIGH
WBC: 10.7 — ABNORMAL HIGH
WBC: 6.1

## 2011-06-23 LAB — URINE MICROSCOPIC-ADD ON

## 2011-06-23 LAB — URINE CULTURE: Colony Count: 60000

## 2011-06-23 LAB — RPR: RPR Ser Ql: NONREACTIVE

## 2011-06-27 DIAGNOSIS — D649 Anemia, unspecified: Secondary | ICD-10-CM | POA: Insufficient documentation

## 2011-06-27 DIAGNOSIS — D219 Benign neoplasm of connective and other soft tissue, unspecified: Secondary | ICD-10-CM | POA: Insufficient documentation

## 2011-06-29 ENCOUNTER — Encounter: Payer: Self-pay | Admitting: Obstetrics and Gynecology

## 2011-06-29 ENCOUNTER — Ambulatory Visit (INDEPENDENT_AMBULATORY_CARE_PROVIDER_SITE_OTHER): Payer: Medicaid Other | Admitting: Obstetrics and Gynecology

## 2011-06-29 VITALS — BP 124/90 | HR 70 | Temp 97.3°F | Wt 163.5 lb

## 2011-06-29 DIAGNOSIS — D649 Anemia, unspecified: Secondary | ICD-10-CM

## 2011-06-29 NOTE — Progress Notes (Signed)
Subjective:     Patient ID: Sheila Dudley, female   DOB: 10/16/1990, 20 y.o.   MRN: [id number]  HPI partum visit: Delivered a female infant weighing 5 lbs. 15 oz. on August 16. Delivery was uneventful with no tears no significant postpartum bleeding. Desires Implanon for birth control.   Review of Systems patient is nursing, no birth control at present. Uneventful delivery. At postpartum anemia.     Objective:   Physical Exam normal     Assessment:    patient had normal Pap smear in April of 2012. 6 weeks postpartum normal.    Plan:    referred to health department for implant insertion. Obtain CBC and determine whether patient need supplemental iron. She's continuing on birth control pills well nursing.

## 2011-06-30 LAB — CBC
Hemoglobin: 10.4 g/dL — ABNORMAL LOW (ref 12.0–15.0)
MCH: 22 pg — ABNORMAL LOW (ref 26.0–34.0)
Platelets: 294 10*3/uL (ref 150–400)
RBC: 4.73 MIL/uL (ref 3.87–5.11)
WBC: 5.3 10*3/uL (ref 4.0–10.5)

## 2011-07-01 LAB — CBC
HCT: 31.8 % — ABNORMAL LOW (ref 36.0–46.0)
Hemoglobin: 10.3 g/dL — ABNORMAL LOW (ref 12.0–15.0)
MCHC: 32.3 g/dL (ref 30.0–36.0)
RBC: 4.57 MIL/uL (ref 3.87–5.11)
RDW: 16.8 % — ABNORMAL HIGH (ref 11.5–15.5)

## 2011-07-01 LAB — URINALYSIS, ROUTINE W REFLEX MICROSCOPIC
Glucose, UA: NEGATIVE mg/dL
Ketones, ur: NEGATIVE mg/dL
Leukocytes, UA: NEGATIVE
Nitrite: NEGATIVE
Specific Gravity, Urine: 1.01 (ref 1.005–1.030)
pH: 7 (ref 5.0–8.0)

## 2011-07-01 LAB — URINE MICROSCOPIC-ADD ON

## 2011-07-01 LAB — POCT PREGNANCY, URINE: Preg Test, Ur: NEGATIVE

## 2011-07-01 LAB — GC/CHLAMYDIA PROBE AMP, GENITAL: Chlamydia, DNA Probe: NEGATIVE

## 2011-07-01 LAB — WET PREP, GENITAL: Clue Cells Wet Prep HPF POC: NONE SEEN

## 2011-07-06 LAB — CBC
HCT: 33 — ABNORMAL LOW
Hemoglobin: 11 — ABNORMAL LOW
MCHC: 33.3
MCV: 71.6 — ABNORMAL LOW
RBC: 4.6
RDW: 15.4 — ABNORMAL HIGH

## 2011-07-06 LAB — URINALYSIS, ROUTINE W REFLEX MICROSCOPIC
Bilirubin Urine: NEGATIVE
Glucose, UA: NEGATIVE
Nitrite: NEGATIVE
Specific Gravity, Urine: 1.015
pH: 6.5

## 2011-07-06 LAB — GC/CHLAMYDIA PROBE AMP, GENITAL
Chlamydia, DNA Probe: POSITIVE — AB
GC Probe Amp, Genital: NEGATIVE

## 2011-07-06 LAB — WET PREP, GENITAL: Yeast Wet Prep HPF POC: NONE SEEN

## 2011-07-06 LAB — POCT PREGNANCY, URINE: Operator id: 120561

## 2011-12-01 ENCOUNTER — Inpatient Hospital Stay (HOSPITAL_COMMUNITY)
Admission: AD | Admit: 2011-12-01 | Discharge: 2011-12-01 | Discharge status: Against Medical Advice | Payer: Self-pay | Source: Ambulatory Visit | Attending: Obstetrics & Gynecology | Admitting: Obstetrics & Gynecology

## 2011-12-01 NOTE — Plan of Care (Signed)
When patient was called to triage at 1010 she was not in the lobby. Admissions stated that she left because she was called to work.

## 2014-07-28 ENCOUNTER — Encounter: Payer: Self-pay | Admitting: Obstetrics and Gynecology

## 2017-07-23 ENCOUNTER — Encounter (HOSPITAL_COMMUNITY): Payer: Self-pay | Admitting: *Deleted

## 2017-07-23 ENCOUNTER — Emergency Department (HOSPITAL_COMMUNITY)
Admission: EM | Admit: 2017-07-23 | Discharge: 2017-07-23 | Disposition: A | Discharge status: Home / Self Care | Payer: Medicaid Other | Attending: Emergency Medicine | Admitting: Emergency Medicine

## 2017-07-23 DIAGNOSIS — L509 Urticaria, unspecified: Secondary | ICD-10-CM | POA: Diagnosis not present

## 2017-07-23 DIAGNOSIS — T7840XA Allergy, unspecified, initial encounter: Secondary | ICD-10-CM | POA: Diagnosis not present

## 2017-07-23 DIAGNOSIS — R21 Rash and other nonspecific skin eruption: Secondary | ICD-10-CM | POA: Diagnosis present

## 2017-07-23 DIAGNOSIS — Z79899 Other long term (current) drug therapy: Secondary | ICD-10-CM | POA: Diagnosis not present

## 2017-07-23 MED ORDER — DIPHENHYDRAMINE HCL 25 MG PO CAPS
50.0000 mg | ORAL_CAPSULE | Freq: Once | ORAL | Status: AC
  Administered 2017-07-23: 50 mg via ORAL
  Filled 2017-07-23: qty 2

## 2017-07-23 MED ORDER — FAMOTIDINE 20 MG PO TABS
20.0000 mg | ORAL_TABLET | Freq: Once | ORAL | Status: AC
  Administered 2017-07-23: 20 mg via ORAL
  Filled 2017-07-23: qty 1

## 2017-07-23 MED ORDER — SODIUM CHLORIDE 0.9 % IV BOLUS (SEPSIS)
1000.0000 mL | Freq: Once | INTRAVENOUS | Status: AC
  Administered 2017-07-23: 1000 mL via INTRAVENOUS

## 2017-07-23 MED ORDER — FAMOTIDINE IN NACL 20-0.9 MG/50ML-% IV SOLN
20.0000 mg | Freq: Once | INTRAVENOUS | Status: AC
  Administered 2017-07-23: 20 mg via INTRAVENOUS
  Filled 2017-07-23: qty 50

## 2017-07-23 MED ORDER — PREDNISONE 20 MG PO TABS: 40.0000 mg | ORAL_TABLET | Freq: Every day | ORAL | 0 refills | Status: DC

## 2017-07-23 MED ORDER — DIPHENHYDRAMINE HCL 25 MG PO CAPS: 25.0000 mg | ORAL_CAPSULE | Freq: Four times a day (QID) | ORAL | 0 refills | Status: DC | PRN

## 2017-07-23 MED ORDER — FAMOTIDINE 20 MG PO TABS: 20.0000 mg | ORAL_TABLET | Freq: Two times a day (BID) | ORAL | 0 refills | Status: AC

## 2017-07-23 MED ORDER — DIPHENHYDRAMINE HCL 50 MG/ML IJ SOLN
25.0000 mg | Freq: Once | INTRAMUSCULAR | Status: AC
  Administered 2017-07-23: 25 mg via INTRAVENOUS
  Filled 2017-07-23: qty 1

## 2017-07-23 MED ORDER — METHYLPREDNISOLONE SODIUM SUCC 125 MG IJ SOLR
125.0000 mg | Freq: Once | INTRAMUSCULAR | Status: AC
  Administered 2017-07-23: 125 mg via INTRAVENOUS
  Filled 2017-07-23: qty 2

## 2017-07-23 MED ORDER — PREDNISONE 20 MG PO TABS
60.0000 mg | ORAL_TABLET | Freq: Once | ORAL | Status: AC
  Administered 2017-07-23: 60 mg via ORAL
  Filled 2017-07-23: qty 3

## 2017-07-23 NOTE — ED Notes (Signed)
Pt presents with rash to neck and arms.  Sts no definitive source.  Thinks it is from her uniform at work; she received a new uniform and put it on at work.

## 2017-07-23 NOTE — ED Triage Notes (Addendum)
Pt c/o rash that started tonight .Reports having a new uniform; has not washed the shirt. Hives noted to arms, chest, and back. Has not taken benadryl. Denies SOB.

## 2017-07-23 NOTE — ED Provider Notes (Signed)
[facility] Provider Note   CSN: [id number] Arrival date & time: 07/23/17  0320     History   Chief Complaint Chief Complaint  Patient presents with  . Rash    HPI Sheila Dudley is a 26 y.o. female.  26 year old female presents to the emergency department for evaluation of an urticarial rash which began this evening. No medications taken prior to arrival for symptoms. Patient reports consistent, worsening urticaria. She believes that her symptoms may be due to a new uniform which she had to wear today. She denies any other new ingestions or topical contacts such as soaps, lotions, detergents. She has not had any lip or tongue swelling, difficulty swallowing, shortness of breath. No associated fevers, vomiting. Patient received medications in triage, but does not feel they are helping sufficiently.   The history is provided by the patient. No language interpreter was used.  Rash      Past Medical History:  Diagnosis Date  . Anemia   . Fibroids   . No pertinent past medical history   . Postpartum depression     Patient Active Problem List   Diagnosis Date Noted  . Anemia 06/27/2011  . Fibroids     Past Surgical History:  Procedure Laterality Date  . TOOTH EXTRACTION      OB History    Gravida Para Term Preterm AB Living   4 3 3  0 1 2   SAB TAB Ectopic Multiple Live Births   0 1 0 0 1       Home Medications    Prior to Admission medications   Medication Sig Start Date End Date Taking? Authorizing Provider  diphenhydrAMINE (BENADRYL) 25 mg capsule Take 1-2 capsules (25-50 mg total) by mouth every 6 (six) hours as needed for itching or allergies. 07/23/17   Antonietta Breach, PA-C  famotidine (PEPCID) 20 MG tablet Take 1 tablet (20 mg total) by mouth 2 (two) times daily. 07/23/17   Antonietta Breach, PA-C  ferrous sulfate (FERROUSUL) 325 (65 FE) MG tablet Take 1 tablet (325 mg total) by mouth 3 (three) times daily with meals.  05/24/11 05/23/12  Melony Overly, MD  predniSONE (DELTASONE) 20 MG tablet Take 2 tablets (40 mg total) by mouth daily. Take 40 mg by mouth daily for 3 days, then 20mg  by mouth daily for 3 days, then 10mg  daily for 3 days 07/23/17   Antonietta Breach, PA-C  prenatal vitamin w/FE, FA (PRENATAL 1 + 1) 27-1 MG TABS Take 1 tablet by mouth daily. 05/24/11   Melony Overly, MD    Family History No family history on file.  Social History Social History  Substance Use Topics  . Smoking status: Never Smoker  . Smokeless tobacco: Never Used  . Alcohol use No     Allergies   Patient has no known allergies.   Review of Systems Review of Systems  Skin: Positive for rash.  Ten systems reviewed and are negative for acute change, except as noted in the HPI.    Physical Exam Updated Vital Signs BP 127/80 (BP Location: Right Arm)   Pulse 88   Temp (!) 97.5 F (36.4 C) (Oral)   Resp 18   SpO2 100%   Physical Exam  Constitutional: She is oriented to person, place, and time. She appears well-developed and well-nourished. No distress.  HENT:  Head: Normocephalic and atraumatic.  No angioedema. Patient tolerating secretions without difficulty.  Eyes: Conjunctivae and EOM are normal. No  scleral icterus.  Neck: Normal range of motion.  No stridor  Cardiovascular: Normal rate, regular rhythm and intact distal pulses.   Pulmonary/Chest: Effort normal. No respiratory distress. She has no wheezes. She has no rales.  Lungs clear to auscultation bilaterally.  Musculoskeletal: Normal range of motion.  Neurological: She is alert and oriented to person, place, and time.  Skin: Skin is warm and dry. Rash noted. She is not diaphoretic. No erythema. No pallor.  Persistent, blanching urticarial rash to chest and extremities; mildly noted to lower face and neck. Negative Nikolsky sign.  Psychiatric: She has a normal mood and affect. Her behavior is normal.  Nursing note and vitals reviewed.    ED Treatments /  Results  Labs (all labs ordered are listed, but only abnormal results are displayed) Labs Reviewed - No data to display  EKG  EKG Interpretation None       Radiology No results found.  Procedures Procedures (including critical care time)  Medications Ordered in ED Medications  diphenhydrAMINE (BENADRYL) capsule 50 mg (50 mg Oral Given 07/23/17 0348)  famotidine (PEPCID) tablet 20 mg (20 mg Oral Given 07/23/17 0348)  predniSONE (DELTASONE) tablet 60 mg (60 mg Oral Given 07/23/17 0348)  sodium chloride 0.9 % bolus 1,000 mL (1,000 mLs Intravenous New Bag/Given 07/23/17 0521)  diphenhydrAMINE (BENADRYL) injection 25 mg (25 mg Intravenous Given 07/23/17 0522)  methylPREDNISolone sodium succinate (SOLU-MEDROL) 125 mg/2 mL injection 125 mg (125 mg Intravenous Given 07/23/17 0524)  famotidine (PEPCID) IVPB 20 mg premix (20 mg Intravenous New Bag/Given 07/23/17 0528)    6:15 AM Patient reassessed. She states the itching has subsided. She reports improvement in her symptoms. Rash appears less erythematous, partially resolved. No difficulty breathing or swallowing. Patient expresses comfort with further outpatient management.   Initial Impression / Assessment and Plan / ED Course  I have reviewed the triage vital signs and the nursing notes.  Pertinent labs & imaging results that were available during my care of the patient were reviewed by me and considered in my medical decision making (see chart for details).     26 year old female presents for an urticarial reaction without sensation of throat closing or SOB. Onset suspected secondary to use of a new work uniform. Vitals have been stable. Patient with no angioedema. Tolerating secretions. No hypoxia. Lungs CTAB. She has been managed with Benadryl, Steroids, and Pepcid with improvement in urticaria.   On reassessment, patient notes improvement to her symptoms. I believe she is stable for further workup and an outpatient basis. Will  discharge with Prednisone taper and Benadryl and Pepcid for PRN use. Return precautions discussed and provided. Patient discharged in stable condition with no unaddressed concerns.   Final Clinical Impressions(s) / ED Diagnoses   Final diagnoses:  Urticaria  Allergic reaction, initial encounter    New Prescriptions New Prescriptions   DIPHENHYDRAMINE (BENADRYL) 25 MG CAPSULE    Take 1-2 capsules (25-50 mg total) by mouth every 6 (six) hours as needed for itching or allergies.   FAMOTIDINE (PEPCID) 20 MG TABLET    Take 1 tablet (20 mg total) by mouth 2 (two) times daily.   PREDNISONE (DELTASONE) 20 MG TABLET    Take 2 tablets (40 mg total) by mouth daily. Take 40 mg by mouth daily for 3 days, then 20mg  by mouth daily for 3 days, then 10mg  daily for 3 days     Antonietta Breach, PA-C 07/23/17 [place], [place], DO 07/23/17 [phone]

## 2017-07-23 NOTE — Discharge Instructions (Signed)
Take prednisone as prescribed until finished. Continue Benadryl and Pepcid as needed for persistent hives. Follow up with a primary care doctor as needed. You may return for new or concerning symptoms.

## 2017-08-13 ENCOUNTER — Emergency Department (HOSPITAL_COMMUNITY)
Admission: EM | Admit: 2017-08-13 | Discharge: 2017-08-13 | Disposition: A | Discharge status: Home / Self Care | Payer: Medicaid Other | Attending: Emergency Medicine | Admitting: Emergency Medicine

## 2017-08-13 ENCOUNTER — Emergency Department (HOSPITAL_COMMUNITY): Payer: Medicaid Other

## 2017-08-13 ENCOUNTER — Encounter (HOSPITAL_COMMUNITY): Payer: Self-pay

## 2017-08-13 DIAGNOSIS — M25511 Pain in right shoulder: Secondary | ICD-10-CM | POA: Diagnosis not present

## 2017-08-13 DIAGNOSIS — Z79899 Other long term (current) drug therapy: Secondary | ICD-10-CM | POA: Insufficient documentation

## 2017-08-13 MED ORDER — CYCLOBENZAPRINE HCL 5 MG PO TABS: 5.0000 mg | ORAL_TABLET | Freq: Two times a day (BID) | ORAL | 0 refills | Status: DC | PRN

## 2017-08-13 NOTE — ED Provider Notes (Signed)
[facility] Provider Note   CSN: [id number] Arrival date & time: 08/13/17  1544     History   Chief Complaint Chief Complaint  Patient presents with  . Shoulder Injury    HPI Sheila Dudley is a 26 y.o. female who presents to the ED with right shoulder pain that started while at work today. Patient reports that she was at work and her right shoulder popped out. She states it has happened before. 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 Patient wanted to make sure it was noting broken but states it feels a lot better and she actually started to leave before she was evaluated.    HPI  Past Medical History:  Diagnosis Date  . Anemia   . Fibroids   . No pertinent past medical history   . Postpartum depression     Patient Active Problem List   Diagnosis Date Noted  . Anemia 06/27/2011  . Fibroids     Past Surgical History:  Procedure Laterality Date  . TOOTH EXTRACTION      OB History    Gravida Para Term Preterm AB Living   4 3 3  0 1 2   SAB TAB Ectopic Multiple Live Births   0 1 0 0 1       Home Medications    Prior to Admission medications   Medication Sig Start Date End Date Taking? Authorizing Provider  cyclobenzaprine (FLEXERIL) 5 MG tablet Take 1 tablet (5 mg total) 2 (two) times daily as needed by mouth for muscle spasms. 08/13/17   Ashley Murrain, NP  diphenhydrAMINE (BENADRYL) 25 mg capsule Take 1-2 capsules (25-50 mg total) by mouth every 6 (six) hours as needed for itching or allergies. 07/23/17   Antonietta Breach, PA-C  famotidine (PEPCID) 20 MG tablet Take 1 tablet (20 mg total) by mouth 2 (two) times daily. 07/23/17   Antonietta Breach, PA-C  ferrous sulfate (FERROUSUL) 325 (65 FE) MG tablet Take 1 tablet (325 mg total) by mouth 3 (three) times daily with meals. 05/24/11 05/23/12  Melony Overly, MD    predniSONE (DELTASONE) 20 MG tablet Take 2 tablets (40 mg total) by mouth daily. Take 40 mg by mouth daily for 3 days, then 20mg  by mouth daily for 3 days, then 10mg  daily for 3 days 07/23/17   Antonietta Breach, PA-C  prenatal vitamin w/FE, FA (PRENATAL 1 + 1) 27-1 MG TABS Take 1 tablet by mouth daily. 05/24/11   Melony Overly, MD    Family History No family history on file.  Social History Social History   Tobacco Use  . Smoking status: Never Smoker  . Smokeless tobacco: Never Used  Substance Use Topics  . Alcohol use: No  . Drug use: No     Allergies   Patient has no known allergies.   Review of Systems Review of Systems  Musculoskeletal: Positive for arthralgias.       Right shoulder pain.  All other systems reviewed and are negative.    Physical Exam Updated Vital Signs BP (!) 126/97 (BP Location: Left Arm)   Pulse (!) 105   Temp 98.4 F (36.9 C) (Oral)   Resp 16   Ht 5\' 4"  (1.626 m)   Wt 79.4 kg (175 lb)   SpO2 100%   BMI 30.04 kg/m   Physical Exam  Constitutional:  She appears well-developed and well-nourished. No distress.  Eyes: EOM are normal.  Neck: Neck supple.  Cardiovascular: Normal rate and regular rhythm.  Pulmonary/Chest: Effort normal and breath sounds normal.  Abdominal: Soft. There is no tenderness.  Musculoskeletal:       Right shoulder: She exhibits tenderness and deformity. She exhibits normal range of motion, no swelling, no crepitus, normal pulse and normal strength.  Shoulders equal, no drop noted. Radial pulses 2+, adequate circulation. Mild pain with rang of motion of the right shoulder.   Neurological: She is alert.  Skin: Skin is warm and dry.  Psychiatric: She has a normal mood and affect. Her behavior is normal.  Nursing note and vitals reviewed.    ED Treatments / Results  Labs (all labs ordered are listed, but only abnormal results are displayed) Labs Reviewed - No data to display  Radiology Dg Shoulder Right  Result  Date: 08/13/2017 CLINICAL DATA:  Right shoulder pain, no known injury, initial encounter EXAM: RIGHT SHOULDER - 2+ VIEW COMPARISON:  None. FINDINGS: There is no evidence of fracture or dislocation. There is no evidence of arthropathy or other focal bone abnormality. Soft tissues are unremarkable. IMPRESSION: No acute abnormality noted. Electronically Signed   By: Inez Catalina M.D.   On: 08/13/2017 16:55    Procedures Procedures (including critical care time)  Medications Ordered in ED Medications - No data to display   Initial Impression / Assessment and Plan / ED Course  I have reviewed the triage vital signs and the nursing notes. 26 y.o. female with right shoulder pain and possible dislocation prior to arrival to the ED stable for d/c without fracture or dislocation noted on x-ray. No neuro deficits. Patient will take ibuprofen and I will prescribe a mild muscle relaxant. Return precautions discussed.  Final Clinical Impressions(s) / ED Diagnoses   Final diagnoses:  Acute pain of right shoulder    ED Discharge Orders        Ordered    cyclobenzaprine (FLEXERIL) 5 MG tablet  2 times daily PRN     08/13/17 1759       Debroah Baller Rowland, NP 08/13/17 1817    Quintella Reichert, MD 08/15/17 [phone]

## 2017-08-13 NOTE — ED Notes (Signed)
Declined W/C at D/C and was escorted to lobby by RN. 

## 2017-08-13 NOTE — ED Triage Notes (Addendum)
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 Reports feeling like it popped out of socket. Pt has good pulses and cap refill noted.

## 2017-08-13 NOTE — ED Triage Notes (Signed)
PT reports her sister is a Marine scientist . Pt reports her sister snapped  her shoulder in place while waiting . Pt reports the shoulder feels better

## 2017-08-13 NOTE — Discharge Instructions (Signed)
Take ibuprofen in addition to the medication we give you . Do not take the muscle relaxer if driving or working as it will make you sleepy.

## 2018-03-16 ENCOUNTER — Encounter (HOSPITAL_COMMUNITY): Payer: Self-pay

## 2018-03-16 ENCOUNTER — Emergency Department (HOSPITAL_COMMUNITY)
Admission: EM | Admit: 2018-03-16 | Discharge: 2018-03-16 | Disposition: A | Discharge status: Home / Self Care | Payer: Medicaid Other | Attending: Emergency Medicine | Admitting: Emergency Medicine

## 2018-03-16 ENCOUNTER — Other Ambulatory Visit: Payer: Self-pay

## 2018-03-16 ENCOUNTER — Emergency Department (HOSPITAL_COMMUNITY): Payer: Medicaid Other

## 2018-03-16 DIAGNOSIS — S060X0A Concussion without loss of consciousness, initial encounter: Secondary | ICD-10-CM | POA: Diagnosis not present

## 2018-03-16 DIAGNOSIS — Z79899 Other long term (current) drug therapy: Secondary | ICD-10-CM | POA: Diagnosis not present

## 2018-03-16 DIAGNOSIS — Y999 Unspecified external cause status: Secondary | ICD-10-CM | POA: Diagnosis not present

## 2018-03-16 DIAGNOSIS — S161XXA Strain of muscle, fascia and tendon at neck level, initial encounter: Secondary | ICD-10-CM

## 2018-03-16 DIAGNOSIS — Y939 Activity, unspecified: Secondary | ICD-10-CM | POA: Insufficient documentation

## 2018-03-16 DIAGNOSIS — Y92410 Unspecified street and highway as the place of occurrence of the external cause: Secondary | ICD-10-CM | POA: Diagnosis not present

## 2018-03-16 DIAGNOSIS — M25512 Pain in left shoulder: Secondary | ICD-10-CM | POA: Diagnosis not present

## 2018-03-16 DIAGNOSIS — S0990XA Unspecified injury of head, initial encounter: Secondary | ICD-10-CM | POA: Diagnosis present

## 2018-03-16 MED ORDER — NAPROXEN 500 MG PO TABS: 500.0000 mg | ORAL_TABLET | Freq: Two times a day (BID) | ORAL | 0 refills | Status: DC

## 2018-03-16 MED ORDER — METHOCARBAMOL 500 MG PO TABS
500.0000 mg | ORAL_TABLET | Freq: Once | ORAL | Status: AC
  Administered 2018-03-16: 500 mg via ORAL
  Filled 2018-03-16: qty 1

## 2018-03-16 MED ORDER — METHOCARBAMOL 500 MG PO TABS: 500.0000 mg | ORAL_TABLET | Freq: Every evening | ORAL | 0 refills | Status: DC | PRN

## 2018-03-16 MED ORDER — ACETAMINOPHEN 325 MG PO TABS
650.0000 mg | ORAL_TABLET | Freq: Once | ORAL | Status: AC
  Administered 2018-03-16: 650 mg via ORAL
  Filled 2018-03-16: qty 2
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
## 2018-03-16 NOTE — ED Notes (Signed)
Patient verbalized understanding of discharge instructions and denies any further needs or questions at this time. VS stable. Patient ambulatory with steady gait.  

## 2018-03-16 NOTE — ED Notes (Signed)
Patient transported to X-ray 

## 2018-03-16 NOTE — ED Notes (Signed)
ED Provider at bedside. 

## 2018-04-25 ENCOUNTER — Ambulatory Visit: Payer: Medicaid Other | Admitting: Physical Therapy

## 2018-04-27 ENCOUNTER — Other Ambulatory Visit: Payer: Self-pay

## 2018-04-27 ENCOUNTER — Encounter: Payer: Self-pay | Admitting: Physical Therapy

## 2018-04-27 ENCOUNTER — Ambulatory Visit: Payer: No Typology Code available for payment source | Attending: Physician Assistant | Admitting: Physical Therapy

## 2018-04-27 DIAGNOSIS — M542 Cervicalgia: Secondary | ICD-10-CM | POA: Diagnosis present

## 2018-04-27 DIAGNOSIS — M62838 Other muscle spasm: Secondary | ICD-10-CM | POA: Insufficient documentation
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
## 2018-05-10 ENCOUNTER — Encounter: Payer: Self-pay | Admitting: Physical Therapy

## 2018-05-10 ENCOUNTER — Ambulatory Visit: Payer: No Typology Code available for payment source | Admitting: Physical Therapy

## 2018-05-10 DIAGNOSIS — M62838 Other muscle spasm: Secondary | ICD-10-CM

## 2018-05-10 DIAGNOSIS — M542 Cervicalgia: Secondary | ICD-10-CM

## 2018-05-10 NOTE — Therapy (Signed)
[place], [place], [address] Phone: [phone]   Fax:  [phone]  Physical Therapy Treatment  Patient Details  Name: Sheila Dudley MRN: [id number] Date of Birth: Jan 31, 1991 Referring Provider: Theresia Bough PA    Encounter Date: 05/10/2018  PT End of Session - 05/10/18 1707    Visit Number  2    Number of Visits  4    Date for PT Re-Evaluation  05/25/18    Authorization Type  medicaid     PT Start Time  0930    PT Stop Time  1022    PT Time Calculation (min)  52 min    Activity Tolerance  Patient tolerated treatment well    Behavior During Therapy  [facility] for tasks assessed/performed       Past Medical History:  Diagnosis Date  . Anemia   . Fibroids   . No pertinent past medical history   . Postpartum depression     Past Surgical History:  Procedure Laterality Date  . TOOTH EXTRACTION      There were no vitals filed for this visit.  Subjective Assessment - 05/10/18 1704    Subjective  Patient reports her neck has improved. She feels like her range is better. She is having 2/10 pain at worst. She has been working on her stretches     Currently in Pain?  Yes    Pain Score  4     Pain Location  Neck    Pain Orientation  Left    Pain Descriptors / Indicators  Aching    Pain Type  Chronic pain    Pain Onset  More than a month ago    Pain Frequency  Constant    Aggravating Factors   looking up and to the side     Pain Relieving Factors  heating pad     Effect of Pain on Daily Activities  pain at night and when brestfeding her child                        [facility] Adult PT Treatment/Exercise - 05/10/18 0001      Neck Exercises: Supine   Other Supine Exercise  wand flexion yellow x10 stpped because of pain     Other Supine Exercise  bilateral ER yellow 2x10; bilateral shoulder adduction yellow 2x10       Manual Therapy   Manual Therapy  Soft tissue mobilization;Manual  Traction    Soft tissue mobilization  IASTYM to bilateral upper traps     Manual Traction  gnetle manual traction and sub-occipital release       Neck Exercises: Stretches   Upper Trapezius Stretch Limitations  2x20 sec hold     Levator Stretch Limitations  2x20 sec hold              PT Education - 05/10/18 1706    Education Details  updated HEP     Person(s) Educated  Patient    Methods  Explanation;Demonstration;Tactile cues;Verbal cues    Comprehension  Verbalized understanding;Returned demonstration;Verbal cues required;Tactile cues required       PT Short Term Goals - 05/10/18 1709      PT SHORT TERM GOAL #1   Title  Patient will increase left cervical rotation by 10 degrees     Baseline  53 degrees     Time  3    Period  Weeks    Status  On-going      PT SHORT TERM GOAL #2   Title  Patient will increase cervical extension by 10 degrees     Baseline  25 degrees with pain     Time  3    Period  Weeks    Status  On-going      PT SHORT TERM GOAL #3   Title  Patient will report 2/10 pain at worst with palpation of the upper trap and cervical spine    Baseline  significant tenderness to palpation     Time  3    Period  Weeks    Status  On-going               Plan - 05/10/18 1708    Clinical Impression Statement  Patient continues to have spasming on the left but it has improved. Per visual inspection her motion has improved significantly. Therapy added light postural exercises for the patient. She reported some pain with flexion with abduction.     Clinical Presentation  Stable    Clinical Decision Making  Moderate    Rehab Potential  Excellent    PT Frequency  1x / week    PT Duration  3 weeks    PT Treatment/Interventions  ADLs/Self Care Home Management;Cryotherapy;Electrical Stimulation;Iontophoresis 4mg /ml Dexamethasone;Traction;Ultrasound;Functional mobility training;Therapeutic activities;Therapeutic exercise;Patient/family education;Neuromuscular  re-education;Manual techniques;Passive range of motion;Taping;Dry needling    PT Next Visit Plan  trigger point release to upper trap; gentle traction; consider needling; give scapular strengthening; consider cervical joint mobilization as needed.     PT Home Exercise Plan  cervical rotation in pain free range; upper trap stretch; levator stretch     Consulted and Agree with Plan of Care  Patient       Patient will benefit from skilled therapeutic intervention in order to improve the following deficits and impairments:  Pain, Decreased activity tolerance, Decreased endurance, Decreased range of motion, Increased muscle spasms  Visit Diagnosis: Cervicalgia  Other muscle spasm     Problem List Patient Active Problem List   Diagnosis Date Noted  . Anemia 06/27/2011  . Fibroids     Carney Living PT DPT  05/10/2018, 5:14 PM  [facility] [address] [place], [place], [address] Phone: [phone]   Fax:  [phone]  Name: Sheila Dudley MRN: [id number] Date of Birth: May 05, 1991

## 2018-05-17 ENCOUNTER — Ambulatory Visit: Payer: No Typology Code available for payment source | Admitting: Physical Therapy

## 2018-05-17 DIAGNOSIS — M542 Cervicalgia: Secondary | ICD-10-CM

## 2018-05-17 DIAGNOSIS — M62838 Other muscle spasm: Secondary | ICD-10-CM

## 2018-05-17 NOTE — Therapy (Signed)
[place], [place], [address] Phone: [phone]   Fax:  [phone]  Physical Therapy Treatment  Patient Details  Name: Sheila Dudley MRN: [id number] Date of Birth: 1991/08/21 Referring Provider: Theresia Bough PA    Encounter Date: 05/17/2018  PT End of Session - 05/17/18 0909    Visit Number  3    Number of Visits  4    Date for PT Re-Evaluation  05/25/18    Authorization Type  medicaid     PT Start Time  0845    PT Stop Time  0926    PT Time Calculation (min)  41 min    Activity Tolerance  Patient tolerated treatment well    Behavior During Therapy  [facility] for tasks assessed/performed       Past Medical History:  Diagnosis Date  . Anemia   . Fibroids   . No pertinent past medical history   . Postpartum depression     Past Surgical History:  Procedure Laterality Date  . TOOTH EXTRACTION      There were no vitals filed for this visit.                    [facility] Adult PT Treatment/Exercise - 05/17/18 0001      Neck Exercises: Standing   Other Standing Exercises  shoulder extension x20 red; scap retraction x20 red       Neck Exercises: Seated   Cervical Rotation Limitations  seated ER red 2x10; seated bilateral abduction 2x10 red; seated shoulder flexion with abduction 2x10 to eye level;       Neck Exercises: Supine   Other Supine Exercise  --    Other Supine Exercise  --      Manual Therapy   Manual Therapy  Soft tissue mobilization;Manual Traction    Soft tissue mobilization  IASTYM to bilateral upper traps     Manual Traction  gnetle manual traction and sub-occipital release       Neck Exercises: Stretches   Upper Trapezius Stretch Limitations  2x20 sec hold     Levator Stretch Limitations  2x20 sec hold                PT Short Term Goals - 05/10/18 1709      PT SHORT TERM GOAL #1   Title  Patient will increase left cervical rotation by 10 degrees      Baseline  53 degrees     Time  3    Period  Weeks    Status  On-going      PT SHORT TERM GOAL #2   Title  Patient will increase cervical extension by 10 degrees     Baseline  25 degrees with pain     Time  3    Period  Weeks    Status  On-going      PT SHORT TERM GOAL #3   Title  Patient will report 2/10 pain at worst with palpation of the upper trap and cervical spine    Baseline  significant tenderness to palpation     Time  3    Period  Weeks    Status  On-going               Plan - 05/17/18 0951    Clinical Impression Statement  Patient is making great progress. Therapy advanced her band to red without an increase in pain. She left her band here.  She stil has a trigger point but it has decreased. Consider discharge next visit if sheis doing better. She will otherwise need a medicaid recert.     Clinical Presentation  Stable    Clinical Decision Making  Low    Rehab Potential  Excellent    PT Frequency  1x / week    PT Duration  3 weeks    PT Treatment/Interventions  ADLs/Self Care Home Management;Cryotherapy;Electrical Stimulation;Iontophoresis 4mg /ml Dexamethasone;Traction;Ultrasound;Functional mobility training;Therapeutic activities;Therapeutic exercise;Patient/family education;Neuromuscular re-education;Manual techniques;Passive range of motion;Taping;Dry needling    PT Next Visit Plan  trigger point release to upper trap; gentle traction; consider needling; give scapular strengthening; consider cervical joint mobilization as needed.     PT Home Exercise Plan  cervical rotation in pain free range; upper trap stretch; levator stretch     Consulted and Agree with Plan of Care  Patient       Patient will benefit from skilled therapeutic intervention in order to improve the following deficits and impairments:  Pain, Decreased activity tolerance, Decreased endurance, Decreased range of motion, Increased muscle spasms  Visit Diagnosis: Cervicalgia  Other muscle  spasm     Problem List Patient Active Problem List   Diagnosis Date Noted  . Anemia 06/27/2011  . Fibroids     Carney Living  PT DPT  05/17/2018, 9:56 AM  [facility] [address] [place], [place], [address] Phone: [phone]   Fax:  [phone]  Name: LATORI BEGGS MRN: [id number] Date of Birth: November 15, 1990

## 2018-05-22 ENCOUNTER — Ambulatory Visit: Payer: No Typology Code available for payment source | Admitting: Physical Therapy

## 2018-05-22 ENCOUNTER — Encounter: Payer: Self-pay | Admitting: Physical Therapy

## 2018-05-22 DIAGNOSIS — M542 Cervicalgia: Secondary | ICD-10-CM

## 2018-05-22 DIAGNOSIS — M62838 Other muscle spasm: Secondary | ICD-10-CM

## 2018-05-22 NOTE — Therapy (Addendum)
[place], [place], [address] Phone: [phone]   Fax:  [phone]  Physical Therapy Treatment/ Discharge   Patient Details  Name: Sheila Dudley MRN: [id number] Date of Birth: 1991-02-23 Referring Provider: Theresia Bough PA    Encounter Date: 05/22/2018  PT End of Session - 05/22/18 0809    Visit Number  4    Number of Visits  4    Date for PT Re-Evaluation  05/25/18    Authorization Type  medicaid     PT Start Time  0800    PT Stop Time  0825    PT Time Calculation (min)  25 min       Past Medical History:  Diagnosis Date  . Anemia   . Fibroids   . No pertinent past medical history   . Postpartum depression     Past Surgical History:  Procedure Laterality Date  . TOOTH EXTRACTION      There were no vitals filed for this visit.      [facility] PT Assessment - 05/22/18 0001      Observation/Other Assessments   Focus on Therapeutic Outcomes (FOTO)   N/T       AROM   Cervical Flexion  65    Cervical Extension  70    Cervical - Right Rotation  85    Cervical - Left Rotation  85                   [facility] Adult PT Treatment/Exercise - 05/22/18 0001      Neck Exercises: Standing   Other Standing Exercises  shoulder extension x20 red; scap retraction x20 red       Neck Exercises: Seated   Cervical Rotation Limitations  standing horizontal abduction , ER , 2 x10, then standing flexionion red x 10, abduction x 10 x 2       Manual Therapy   Soft tissue mobilization  IASTM to left upper trap       Neck Exercises: Stretches   Upper Trapezius Stretch Limitations  2x20 sec hold     Levator Stretch Limitations  2x20 sec hold                PT Short Term Goals - 05/22/18 0831      PT SHORT TERM GOAL #1   Title  Patient will increase left cervical rotation by 10 degrees     Baseline  53 degrees at eval : see objective measures     Time  3    Period  Weeks    Status   Achieved      PT SHORT TERM GOAL #2   Title  Patient will increase cervical extension by 10 degrees     Baseline  25 degrees with pain at eval: see objective measures     Time  3    Period  Weeks    Status  Achieved      PT SHORT TERM GOAL #3   Title  Patient will report 2/10 pain at worst with palpation of the upper trap and cervical spine    Baseline  significant tenderness to palpation at eval; no pain on palpation at discharge     Time  3    Period  Weeks    Status  Achieved               Plan - 05/22/18 0829    Clinical Impression Statement  Pt  arrives reporting no pain. Her husband has massaged her upper traps everynight and she is consistant with HEP. She has improved AROM in all planes of cervical spine to WNL. She has no pain to palpation today. Al LTGs met. She is agreeable to discharge today.     PT Next Visit Plan  discharge today     PT Home Exercise Plan  cervical rotation in pain free range; upper trap stretch; levator stretch , red band shoulder rows, extension, horizontal abduction and ER     Consulted and Agree with Plan of Care  Patient       Patient will benefit from skilled therapeutic intervention in order to improve the following deficits and impairments:  Pain, Decreased activity tolerance, Decreased endurance, Decreased range of motion, Increased muscle spasms  Visit Diagnosis: Cervicalgia  Other muscle spasm  PHYSICAL THERAPY DISCHARGE SUMMARY  Visits from Start of Care: 4  Current functional level related to goals / functional outcomes: Significant improvements in range and function    Remaining deficits: Nothing major    Education / Equipment: HEP   Plan: Patient agrees to discharge.  Patient goals were met. Patient is being discharged due to not returning since the last visit.  ?????       Problem List Patient Active Problem List   Diagnosis Date Noted  . Anemia 06/27/2011  . Fibroids    Carolyne Littles  PT DPT  05/22/2018    Dorene Ar, PTA 05/22/2018, 8:34 AM     [facility] [address] [place], [place], [address] Phone: [phone]   Fax:  [phone]  Name: Sheila Dudley MRN: [id number] Date of Birth: 1991/05/07

## 2018-07-24 ENCOUNTER — Encounter: Payer: Self-pay | Admitting: Physical Therapy
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
## 2023-08-16 LAB — OB RESULTS CONSOLE RUBELLA ANTIBODY, IGM: Rubella: IMMUNE

## 2023-08-16 LAB — OB RESULTS CONSOLE HIV ANTIBODY (ROUTINE TESTING): HIV: NONREACTIVE

## 2023-08-16 LAB — OB RESULTS CONSOLE HEPATITIS B SURFACE ANTIGEN: Hepatitis B Surface Ag: NEGATIVE

## 2023-08-16 LAB — OB RESULTS CONSOLE GC/CHLAMYDIA
Chlamydia: NEGATIVE
Neisseria Gonorrhea: NEGATIVE

## 2023-08-16 LAB — OB RESULTS CONSOLE RPR: RPR: NONREACTIVE

## 2023-09-13 ENCOUNTER — Encounter (HOSPITAL_COMMUNITY): Payer: Self-pay | Admitting: Obstetrics & Gynecology

## 2023-09-13 ENCOUNTER — Inpatient Hospital Stay (HOSPITAL_COMMUNITY)
Admission: AD | Admit: 2023-09-13 | Discharge: 2023-09-13 | Disposition: A | Discharge status: Home / Self Care | Payer: Medicaid Other | Attending: Obstetrics & Gynecology | Admitting: Obstetrics & Gynecology

## 2023-09-13 DIAGNOSIS — Z3A15 15 weeks gestation of pregnancy: Secondary | ICD-10-CM | POA: Diagnosis not present

## 2023-09-13 DIAGNOSIS — O26892 Other specified pregnancy related conditions, second trimester: Secondary | ICD-10-CM

## 2023-09-13 DIAGNOSIS — O26899 Other specified pregnancy related conditions, unspecified trimester: Secondary | ICD-10-CM

## 2023-09-13 DIAGNOSIS — R102 Pelvic and perineal pain: Secondary | ICD-10-CM | POA: Diagnosis present

## 2023-09-13 LAB — URINALYSIS, ROUTINE W REFLEX MICROSCOPIC
Bilirubin Urine: NEGATIVE
Glucose, UA: NEGATIVE mg/dL
Hgb urine dipstick: NEGATIVE
Ketones, ur: NEGATIVE mg/dL
Leukocytes,Ua: NEGATIVE
Nitrite: NEGATIVE
Protein, ur: NEGATIVE mg/dL
Specific Gravity, Urine: 1.013 (ref 1.005–1.030)
pH: 6 (ref 5.0–8.0)

## 2023-09-13 LAB — WET PREP, GENITAL
Clue Cells Wet Prep HPF POC: NONE SEEN
Sperm: NONE SEEN
Trich, Wet Prep: NONE SEEN
WBC, Wet Prep HPF POC: <10 (ref <10)
Yeast Wet Prep HPF POC: NONE SEEN

## 2023-09-13 NOTE — MAU Provider Note (Signed)
Chief Complaint: Abdominal Pain and Vaginal Discharge   Event Date/Time   First Provider Initiated Contact with Patient 09/13/23 1859      SUBJECTIVE HPI: Sheila Dudley is a 32 y.o. [id number] at [redacted]w[redacted]d by LMP who presents to maternity admissions reporting round ligament pain.  Patient notes pelvic pain on bilateral hips that radiates down to groin. Mostly when she's active at work. Is a CNA. Lasted longer than usual today and was worried. Tylenol somewhat helpful. Did have heavier discharge than usual last night. Describes as clear "slime". No further leaking, VB, vaginal itching, urinary symptoms.  Established with [facility] and had dating ultrasound around 11 weeks.  HPI  Past Medical History:  Diagnosis Date   Anemia    Fibroids    No pertinent past medical history    Postpartum depression    Past Surgical History:  Procedure Laterality Date   NO PAST SURGERIES     TOOTH EXTRACTION     Social History   Socioeconomic History   Marital status: Single    Spouse name: Not on file   Number of children: Not on file   Years of education: Not on file   Highest education level: Not on file  Occupational History   Not on file  Tobacco Use   Smoking status: Never   Smokeless tobacco: Never   Tobacco comments:    Quit vaping Aug 2024  Vaping Use   Vaping status: Former  Substance and Sexual Activity   Alcohol use: Not Currently   Drug use: No   Sexual activity: Yes  Other Topics Concern   Not on file  Social History Narrative   Not on file   Social Drivers of Health   Financial Resource Strain: Not on file  Food Insecurity: Not on file  Transportation Needs: Not on file  Physical Activity: Not on file  Stress: Not on file  Social Connections: Not on file  Intimate Partner Violence: Not on file   No current facility-administered medications on file prior to encounter.   Current Outpatient Medications on File Prior to Encounter  Medication Sig Dispense Refill    acetaminophen (TYLENOL) 325 MG tablet Take 650 mg by mouth every 6 (six) hours as needed.     ferrous sulfate (FERROUSUL) 325 (65 FE) MG tablet Take 1 tablet (325 mg total) by mouth 3 (three) times daily with meals. (Patient taking differently: Take 325 mg by mouth daily.) 30 tablet 11   prenatal vitamin w/FE, FA (PRENATAL 1 + 1) 27-1 MG TABS Take 1 tablet by mouth daily. 30 each 3   famotidine (PEPCID) 20 MG tablet Take 1 tablet (20 mg total) by mouth 2 (two) times daily. 30 tablet 0   No Known Allergies  ROS:  Pertinent positives/negatives listed above.  I have reviewed patient's Past Medical Hx, Surgical Hx, Family Hx, Social Hx, medications and allergies.   Physical Exam  Patient Vitals for the past 24 hrs:  BP Temp Temp src Pulse Resp SpO2 Height Weight  09/13/23 1739 113/73 98.5 F (36.9 C) Oral 94 17 100 % 5\' 4"  (1.626 m) 79.7 kg   Constitutional: Well-developed, well-nourished female in no acute distress.  Cardiovascular: normal rate Respiratory: normal effort GI: Abd soft, non-tender. Pos BS x 4 MS: Extremities nontender, no edema, normal ROM Neurologic: Alert and oriented x 4.   Doppler: 150 BSUS: IUP with active movement, appropriate cardiac activity  LAB RESULTS Results for orders placed or performed during the hospital encounter of  09/13/23 (from the past 24 hours)  Urinalysis, Routine w reflex microscopic -Urine, Clean Catch     Status: None   Collection Time: 09/13/23  6:10 PM  Result Value Ref Range   Color, Urine YELLOW YELLOW   APPearance CLEAR CLEAR   Specific Gravity, Urine 1.013 1.005 - 1.030   pH 6.0 5.0 - 8.0   Glucose, UA NEGATIVE NEGATIVE mg/dL   Hgb urine dipstick NEGATIVE NEGATIVE   Bilirubin Urine NEGATIVE NEGATIVE   Ketones, ur NEGATIVE NEGATIVE mg/dL   Protein, ur NEGATIVE NEGATIVE mg/dL   Nitrite NEGATIVE NEGATIVE   Leukocytes,Ua NEGATIVE NEGATIVE  Wet prep, genital     Status: None   Collection Time: 09/13/23  6:13 PM   Specimen: Urine,  Clean Catch  Result Value Ref Range   Yeast Wet Prep HPF POC NONE SEEN NONE SEEN   Trich, Wet Prep NONE SEEN NONE SEEN   Clue Cells Wet Prep HPF POC NONE SEEN NONE SEEN   WBC, Wet Prep HPF POC <10 <10   Sperm NONE SEEN       IMAGING No results found.  MAU Management/MDM: Orders Placed This Encounter  Procedures   Wet prep, genital   Urinalysis, Routine w reflex microscopic -Urine, Clean Catch   Discharge patient    No orders of the defined types were placed in this encounter.  Patient seen with concern for pelvic pain at 15 weeks. Is consistent with round ligament/pelvic pain consistent with pregnancy. Discussed that as this is her 5th pregnancy, pelvic floor tends to stretch earlier and can cause more discomfort. Reassuringly does not have a UTI, vaginitis. Reassuring fetal status as well.  Discussed conservative measures for pelvic pain including tylenol, rest, heat. Work note provided with work restrictions. 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  ASSESSMENT 1. Pain of round ligament affecting pregnancy, antepartum   2. [redacted] weeks gestation of pregnancy     PLAN Discharge home with strict return precautions. Allergies as of 09/13/2023   No Known Allergies      Medication List     STOP taking these medications    cyclobenzaprine 5 MG tablet Commonly known as: FLEXERIL   diphenhydrAMINE 25 mg capsule Commonly known as: BENADRYL   methocarbamol 500 MG tablet Commonly known as: ROBAXIN   naproxen 500 MG tablet Commonly known as: NAPROSYN   predniSONE 20 MG tablet Commonly known as: DELTASONE       TAKE these medications    acetaminophen 325 MG tablet Commonly known as: TYLENOL Take 650 mg by mouth every 6 (six) hours as needed.   famotidine 20 MG tablet Commonly known as: Pepcid Take 1 tablet (20 mg total) by mouth 2 (two) times daily.   ferrous sulfate 325 (65 FE) MG tablet Commonly known as: FerrouSul Take 1 tablet (325 mg  total) by mouth 3 (three) times daily with meals. What changed: when to take this   prenatal vitamin w/FE, FA 27-1 MG Tabs tablet Take 1 tablet by mouth daily.         Wylene Simmer, MD OB Fellow 09/13/2023  7:40 PM

## 2023-09-13 NOTE — MAU Note (Signed)
[name] is a 32 y.o. at 15.5wks, here in MAU reporting: started yesterday, from 3-11,  thought it was round ligament pain, but never had it last so long. Really couldn't stand.  Around 0200, had a gush come out.  Clear d/c with a little bit of blood. - "slime'.  Only happened the one time.  No intercourse.  Pains have continued today, has been more in RLQ today, last night was across the bottom. Became nauseated when first started last night.  Onset of complaint: last night Pain score: 8 Vitals:   09/13/23 1739  BP: 113/73  Pulse: 94  Resp: 17  Temp: 98.5 F (36.9 C)  SpO2: 100%     FHT:150 Lab orders placed from triage:  UA and vag cultures   Had [place] at 11wk at [facility]-everything was ok on that

## 2023-09-14 LAB — GC/CHLAMYDIA PROBE AMP (~~LOC~~) NOT AT ARMC
Chlamydia: NEGATIVE
Comment: NEGATIVE
Comment: NORMAL
Neisseria Gonorrhea: NEGATIVE

## 2023-09-27 NOTE — L&D Delivery Note (Addendum)
 Delivery Note I was called to the bedside after patient had spontaneous rupture of membranes and felt urge to push. I arrived to find patient fully dilated and with + 1 station. She proceeded to push with guidance and at 4:24 AM a viable female was delivered via Vaginal, Spontaneous (Presentation: Right Occiput Anterior).  APGAR: 8, 9; weight  pending.  Placenta status: Spontaneous, Intact.  Cord: 3 vessels with the following complications: None.  Cord pH: Not collected. Loose nuchal cord was reduced.  Baby's nose and mouth suctioned at perineum.  Shoulders were delivered without difficulty, Left shoulder was anterior.  Upon delivery, baby was placed on mother's abdomen where it was dried and stimulated.  Pitocin  was started at this point.  Baby had a spontaneous cry and good tone noted.  Delayed cord clamping was done after the cord stopped pulsating.  Cord was cut by father of baby in the room.  Placenta delivered with gentle traction on cord and abdominal counter traction, placenta to labor and delivery.    Anesthesia: Epidural Episiotomy: None Lacerations: None Suture Repair: N/A Est. Blood Loss (mL): 100  Mom to postpartum.  Baby to Couplet care / Skin to Skin.  Charlott Converse 02/27/2024, 4:48 AM

## 2024-02-16 LAB — OB RESULTS CONSOLE GBS: GBS: POSITIVE

## 2024-02-21 ENCOUNTER — Inpatient Hospital Stay (HOSPITAL_COMMUNITY)
Admission: AD | Admit: 2024-02-21 | Discharge: 2024-02-21 | Disposition: A | Discharge status: Home / Self Care | Attending: Obstetrics and Gynecology | Admitting: Obstetrics and Gynecology

## 2024-02-21 ENCOUNTER — Inpatient Hospital Stay (HOSPITAL_BASED_OUTPATIENT_CLINIC_OR_DEPARTMENT_OTHER)

## 2024-02-21 ENCOUNTER — Encounter (HOSPITAL_COMMUNITY): Payer: Self-pay | Admitting: Obstetrics and Gynecology

## 2024-02-21 DIAGNOSIS — Z3A38 38 weeks gestation of pregnancy: Secondary | ICD-10-CM | POA: Insufficient documentation

## 2024-02-21 DIAGNOSIS — Z3A30 30 weeks gestation of pregnancy: Secondary | ICD-10-CM

## 2024-02-21 DIAGNOSIS — O368130 Decreased fetal movements, third trimester, not applicable or unspecified: Secondary | ICD-10-CM | POA: Diagnosis present

## 2024-02-21 NOTE — MAU Provider Note (Signed)
 MAU Provider Note  Chief Complaint: Decreased Fetal Movement  SUBJECTIVE HPI: Sheila Dudley is a 33 y.o. [id number] at [redacted]w[redacted]d by LMP who presents to maternity admissions reporting DFM. Pregnancy c/b none. Receives [facility] with [facility].  Patient presents with decreased fetal movement throughout the day. Tried some caffeine/sugar without help. Is feeling some contractions. Denies leaking, vaginal bleeding, urinary symptoms, change in discharge.   HPI  Past Medical History:  Diagnosis Date   Anemia    Fibroids    No pertinent past medical history    Postpartum depression    Past Surgical History:  Procedure Laterality Date   NO PAST SURGERIES     TOOTH EXTRACTION     Social History   Socioeconomic History   Marital status: Single    Spouse name: Not on file   Number of children: Not on file   Years of education: Not on file   Highest education level: Not on file  Occupational History   Not on file  Tobacco Use   Smoking status: Never   Smokeless tobacco: Never   Tobacco comments:    Quit vaping Aug 2024  Vaping Use   Vaping status: Former  Substance and Sexual Activity   Alcohol use: Not Currently   Drug use: No   Sexual activity: Yes  Other Topics Concern   Not on file  Social History Narrative   Not on file   Social Drivers of Health   Financial Resource Strain: Not on file  Food Insecurity: Not on file  Transportation Needs: Not on file  Physical Activity: Not on file  Stress: Not on file  Social Connections: Not on file  Intimate Partner Violence: Not on file   No current facility-administered medications on file prior to encounter.   Current Outpatient Medications on File Prior to Encounter  Medication Sig Dispense Refill   acetaminophen  (TYLENOL ) 325 MG tablet Take 650 mg by mouth every 6 (six) hours as needed.     ferrous sulfate  (FERROUSUL) 325 (65 FE) MG tablet Take 1 tablet (325 mg total) by mouth 3 (three) times daily with meals. (Patient taking  differently: Take 325 mg by mouth daily.) 30 tablet 11   prenatal vitamin w/FE, FA (PRENATAL 1 + 1) 27-1 MG TABS Take 1 tablet by mouth daily. 30 each 3   famotidine  (PEPCID ) 20 MG tablet Take 1 tablet (20 mg total) by mouth 2 (two) times daily. 30 tablet 0   No Known Allergies  ROS:  Pertinent positives/negatives listed above.  I have reviewed patient's Past Medical Hx, Surgical Hx, Family Hx, Social Hx, medications and allergies.   Physical Exam  Patient Vitals for the past 24 hrs:  BP Temp Temp src Pulse Resp SpO2 Height Weight  02/21/24 1956 113/68 -- -- 99 -- 98 % -- --  02/21/24 1919 113/72 98.2 F (36.8 C) Oral 95 16 100 % 5\' 4"  (1.626 m) 91.3 kg   Constitutional: Well-developed, well-nourished female in no acute distress  Cardiovascular: normal rate Respiratory: normal effort GI: Abd soft, non-tender MS: Extremities nontender, no edema, normal ROM Neurologic: Alert and oriented x 4  GU: Neg CVAT  FHT:  Baseline 145, moderate variability, accelerations present, late deceleration Contractions: irregular  LAB RESULTS No results found for this or any previous visit (from the past 24 hours).     IMAGING No results found.  MAU Management/MDM: No orders of the defined types were placed in this encounter.   No orders of the defined types  were placed in this encounter.    Available prenatal records reviewed.  Patient presents with DFM at [redacted]w[redacted]d in setting of uncomplicated pregnancy.  Overall FHT does have moderate variability with spontaneous accelerations, however has had 3 lates with contractions.  In the setting of decreased fetal movement and category 2 tracing, I recommended induction.  Discussed this with both Jade, CNM, and Dr. Elisha Guillaume.  Jade is to come assess patient at the bedside and will take over care at this time.  ASSESSMENT 1. Decreased fetal movements in third trimester, single or unspecified fetus     PLAN Transferred care to [facility].  Authur Leghorn, MD OB Fellow 02/21/2024  9:15 PM

## 2024-02-21 NOTE — MAU Provider Note (Signed)
 MAU Provider Note  Chief Complaint: Decreased Fetal Movement  SUBJECTIVE HPI: Sheila Dudley is a 33 y.o. [id number] at [redacted]w[redacted]d by LMP who presents to maternity admissions reporting DFM. Pregnancy c/b none. Receives [facility] with [facility].  Patient presents with c/o, "I have felt the baby move all day just not as much and I usually feel the baby hiccup a lot but not as much today". Pt was in in MAU when she was first put on the monitor she was lying flat on her back and a late decel was noted,  at 1944, ,then one more at 2029, otherwise has been a Cat 1 strip reactive NST for last hours without decel noted with moderate variability and acells noted. Pt endorses "since I have been here I have felt good fetal movement when I was lying on my left side and feeling better", "I do not think anything is wring. Is feeling some contractions. Denies leaking, vaginal bleeding, urinary symptoms, change in discharge.   HPI  Past Medical History:  Diagnosis Date   Anemia    Fibroids    No pertinent past medical history    Postpartum depression    Past Surgical History:  Procedure Laterality Date   NO PAST SURGERIES     TOOTH EXTRACTION     Social History   Socioeconomic History   Marital status: Single    Spouse name: Not on file   Number of children: Not on file   Years of education: Not on file   Highest education level: Not on file  Occupational History   Not on file  Tobacco Use   Smoking status: Never   Smokeless tobacco: Never   Tobacco comments:    Quit vaping Aug 2024  Vaping Use   Vaping status: Former  Substance and Sexual Activity   Alcohol use: Not Currently   Drug use: No   Sexual activity: Yes  Other Topics Concern   Not on file  Social History Narrative   Not on file   Social Drivers of Health   Financial Resource Strain: Not on file  Food Insecurity: Not on file  Transportation Needs: Not on file  Physical Activity: Not on file  Stress: Not on file  Social Connections:  Not on file  Intimate Partner Violence: Not on file   No current facility-administered medications on file prior to encounter.   Current Outpatient Medications on File Prior to Encounter  Medication Sig Dispense Refill   acetaminophen  (TYLENOL ) 325 MG tablet Take 650 mg by mouth every 6 (six) hours as needed.     ferrous sulfate  (FERROUSUL) 325 (65 FE) MG tablet Take 1 tablet (325 mg total) by mouth 3 (three) times daily with meals. (Patient taking differently: Take 325 mg by mouth daily.) 30 tablet 11   prenatal vitamin w/FE, FA (PRENATAL 1 + 1) 27-1 MG TABS Take 1 tablet by mouth daily. 30 each 3   famotidine  (PEPCID ) 20 MG tablet Take 1 tablet (20 mg total) by mouth 2 (two) times daily. 30 tablet 0   No Known Allergies  ROS:  Pertinent positives/negatives listed above.  I have reviewed patient's Past Medical Hx, Surgical Hx, Family Hx, Social Hx, medications and allergies.   Physical Exam  Patient Vitals for the past 24 hrs:  BP Temp Temp src Pulse Resp SpO2 Height Weight  02/21/24 1956 113/68 -- -- 99 -- 98 % -- --  02/21/24 1919 113/72 98.2 F (36.8 C) Oral 95 16 100 % 5\' 4"  (  1.626 m) 91.3 kg   Constitutional: Well-developed, well-nourished female in no acute distress  Cardiovascular: normal rate Respiratory: normal effort GI: Abd soft, non-tender MS: Extremities nontender, no edema, normal ROM Neurologic: Alert and oriented x 4  GU: Neg CVAT  FHT:  Baseline 140, moderate variability, accelerations present, no decels in last  hours.  Contractions: irregular every 2-9  SVE: 3/50/-3 vertex by sutures.   LAB RESULTS No results found for this or any previous visit (from the past 24 hours).     IMAGING No results found.  MAU Management/MDM: Orders Placed This Encounter  Procedures   US  MFM FETAL BPP WO NON STRESS    No orders of the defined types were placed in this encounter.    Available prenatal records reviewed.  Patient presents with DFM at [redacted]w[redacted]d in  setting of uncomplicated pregnancy.  Overall FHT does have moderate variability with spontaneous accelerations, has two late decels , last one over an hours ago, BPP 8/8, resolved DFM per pt, and last hour of reactive NST.Aaron Aas Was recommended by three MD for pt to stay and receive an IOL, DR Adrain Hopes, Dr Nolon Baxter, DR Autry-Lott all recommended pt to stay for induction, I reviewed the risk, benefit and alternative with pt, pt declined an induction and would like to go home with a follow up NST tomorrow with [facility], pt will call out of work, and pt was educated on if she feels DFM again or feels something is wrong or vaginal bleeding she will need an induction, her next ROB with [facility] is this Friday.   ASSESSMENT 1. Decreased fetal movements in third trimester, single or unspecified fetus   Resolved, stable.   PLAN Discharge home with NST tomorrow with [facility] and ROB on Friday.   Kaydie Petsch  CNM, FNP 10:17 PM 02/21/24

## 2024-02-21 NOTE — MAU Note (Signed)
..  Sheila Dudley is a 33 y.o. at [redacted]w[redacted]d here in MAU reporting: Noticed at 6pm that baby had not been moving as much as normal. Reports she had a frappe and coke to wake baby up and still has not felt much movement.  Denies pain, vaginal bleeding, or leaking of fluid.   Pain score: 0/10 Vitals:   02/21/24 1919  BP: 113/72  Pulse: 95  Resp: 16  Temp: 98.2 F (36.8 C)  SpO2: 100%     FHT:150 Lab orders placed from triage:  none

## 2024-02-26 ENCOUNTER — Other Ambulatory Visit: Payer: Self-pay

## 2024-02-26 ENCOUNTER — Encounter (HOSPITAL_COMMUNITY): Payer: Self-pay | Admitting: Obstetrics & Gynecology

## 2024-02-26 ENCOUNTER — Inpatient Hospital Stay (HOSPITAL_COMMUNITY)
Admission: AD | Admit: 2024-02-26 | Discharge: 2024-02-26 | Disposition: A | Discharge status: Home / Self Care | Payer: Self-pay | Attending: Obstetrics & Gynecology | Admitting: Obstetrics & Gynecology

## 2024-02-26 DIAGNOSIS — O471 False labor at or after 37 completed weeks of gestation: Secondary | ICD-10-CM | POA: Diagnosis present

## 2024-02-26 DIAGNOSIS — Z3A39 39 weeks gestation of pregnancy: Secondary | ICD-10-CM | POA: Diagnosis not present

## 2024-02-26 DIAGNOSIS — O479 False labor, unspecified: Secondary | ICD-10-CM

## 2024-02-26 NOTE — MAU Note (Signed)
.  Sheila Dudley is a 33 y.o. at [redacted]w[redacted]d here in MAU reporting: Patient reports ctx that's started 0745. Patient 6-9 minutes,  Denies LOF. Denies vaginal bleeding reports+FM  Onset of complaint:  Pain score: 6/10 There were no vitals filed for this visit.    Lab orders placed from triage:   NONE

## 2024-02-27 ENCOUNTER — Inpatient Hospital Stay (HOSPITAL_COMMUNITY): Admitting: Anesthesiology

## 2024-02-27 ENCOUNTER — Inpatient Hospital Stay (HOSPITAL_COMMUNITY)
Admission: AD | Admit: 2024-02-27 | Discharge: 2024-02-29 | DRG: 807 | Disposition: A | Discharge status: Home / Self Care | Attending: Obstetrics & Gynecology | Admitting: Obstetrics & Gynecology

## 2024-02-27 ENCOUNTER — Encounter (HOSPITAL_COMMUNITY): Payer: Self-pay | Admitting: Obstetrics & Gynecology

## 2024-02-27 DIAGNOSIS — O99824 Streptococcus B carrier state complicating childbirth: Secondary | ICD-10-CM | POA: Diagnosis present

## 2024-02-27 DIAGNOSIS — Z148 Genetic carrier of other disease: Secondary | ICD-10-CM | POA: Diagnosis not present

## 2024-02-27 DIAGNOSIS — Z3A39 39 weeks gestation of pregnancy: Secondary | ICD-10-CM

## 2024-02-27 DIAGNOSIS — Z833 Family history of diabetes mellitus: Secondary | ICD-10-CM

## 2024-02-27 DIAGNOSIS — O26893 Other specified pregnancy related conditions, third trimester: Secondary | ICD-10-CM | POA: Diagnosis present

## 2024-02-27 DIAGNOSIS — O9902 Anemia complicating childbirth: Principal | ICD-10-CM | POA: Diagnosis present

## 2024-02-27 DIAGNOSIS — D509 Iron deficiency anemia, unspecified: Secondary | ICD-10-CM | POA: Diagnosis present

## 2024-02-27 LAB — TYPE AND SCREEN
ABO/RH(D): A POS
Antibody Screen: NEGATIVE

## 2024-02-27 LAB — CBC
HCT: 31.6 % — ABNORMAL LOW (ref 36.0–46.0)
Hemoglobin: 9.8 g/dL — ABNORMAL LOW (ref 12.0–15.0)
MCH: 22.8 pg — ABNORMAL LOW (ref 26.0–34.0)
MCHC: 31 g/dL (ref 30.0–36.0)
MCV: 73.7 fL — ABNORMAL LOW (ref 80.0–100.0)
Platelets: 305 10*3/uL (ref 150–400)
RBC: 4.29 MIL/uL (ref 3.87–5.11)
RDW: 15.9 % — ABNORMAL HIGH (ref 11.5–15.5)
WBC: 6.8 10*3/uL (ref 4.0–10.5)
nRBC: 0 % (ref 0.0–0.2)

## 2024-02-27 LAB — RPR: RPR Ser Ql: NONREACTIVE

## 2024-02-27 MED ORDER — SODIUM CHLORIDE 0.9 % IV SOLN
5.0000 10*6.[IU] | Freq: Once | INTRAVENOUS | Status: AC
  Administered 2024-02-27: 5 10*6.[IU] via INTRAVENOUS
  Filled 2024-02-27: qty 5

## 2024-02-27 MED ORDER — EPHEDRINE 5 MG/ML INJ: 10.0000 mg | INTRAVENOUS | Status: DC | PRN

## 2024-02-27 MED ORDER — ONDANSETRON HCL 4 MG/2ML IJ SOLN: 4.0000 mg | INTRAMUSCULAR | Status: DC | PRN

## 2024-02-27 MED ORDER — LACTATED RINGERS IV SOLN: INTRAVENOUS | Status: DC

## 2024-02-27 MED ORDER — DIBUCAINE (PERIANAL) 1 % EX OINT: 1.0000 | TOPICAL_OINTMENT | CUTANEOUS | Status: DC | PRN

## 2024-02-27 MED ORDER — MAGNESIUM HYDROXIDE 400 MG/5ML PO SUSP
30.0000 mL | ORAL | Status: DC | PRN
Start: 2024-02-27 — End: 2024-02-29

## 2024-02-27 MED ORDER — ACETAMINOPHEN 325 MG PO TABS
650.0000 mg | ORAL_TABLET | ORAL | Status: DC | PRN
  Administered 2024-02-27 – 2024-02-28 (×2): 650 mg via ORAL
  Filled 2024-02-27 (×2): qty 2

## 2024-02-27 MED ORDER — LACTATED RINGERS IV SOLN
500.0000 mL | Freq: Once | INTRAVENOUS | Status: AC
  Administered 2024-02-27: 500 mL via INTRAVENOUS

## 2024-02-27 MED ORDER — DIPHENHYDRAMINE HCL 50 MG/ML IJ SOLN: 12.5000 mg | INTRAMUSCULAR | Status: DC | PRN

## 2024-02-27 MED ORDER — IBUPROFEN 600 MG PO TABS
600.0000 mg | ORAL_TABLET | Freq: Four times a day (QID) | ORAL | Status: DC
Start: 2024-02-27 — End: 2024-02-29
  Administered 2024-02-27 – 2024-02-29 (×10): 600 mg via ORAL
  Filled 2024-02-27 (×10): qty 1

## 2024-02-27 MED ORDER — OXYTOCIN-SODIUM CHLORIDE 30-0.9 UT/500ML-% IV SOLN
2.5000 [IU]/h | INTRAVENOUS | Status: DC
  Administered 2024-02-27: 2.5 [IU]/h via INTRAVENOUS
  Filled 2024-02-27: qty 500

## 2024-02-27 MED ORDER — COCONUT OIL OIL: 1.0000 | TOPICAL_OIL | Status: DC | PRN

## 2024-02-27 MED ORDER — SIMETHICONE 80 MG PO CHEW: 80.0000 mg | CHEWABLE_TABLET | ORAL | Status: DC | PRN

## 2024-02-27 MED ORDER — PRENATAL MULTIVITAMIN CH
1.0000 | ORAL_TABLET | Freq: Every day | ORAL | Status: DC
  Administered 2024-02-27 – 2024-02-29 (×3): 1 via ORAL
  Filled 2024-02-27 (×3): qty 1

## 2024-02-27 MED ORDER — ONDANSETRON HCL 4 MG PO TABS
4.0000 mg | ORAL_TABLET | ORAL | Status: DC | PRN
Start: 2024-02-27 — End: 2024-02-29

## 2024-02-27 MED ORDER — LACTATED RINGERS IV SOLN: 500.0000 mL | INTRAVENOUS | Status: DC | PRN

## 2024-02-27 MED ORDER — OXYCODONE-ACETAMINOPHEN 5-325 MG PO TABS: 1.0000 | ORAL_TABLET | ORAL | Status: DC | PRN

## 2024-02-27 MED ORDER — OXYCODONE-ACETAMINOPHEN 5-325 MG PO TABS: 2.0000 | ORAL_TABLET | ORAL | Status: DC | PRN

## 2024-02-27 MED ORDER — ZOLPIDEM TARTRATE 5 MG PO TABS: 5.0000 mg | ORAL_TABLET | Freq: Every evening | ORAL | Status: DC | PRN

## 2024-02-27 MED ORDER — POLYSACCHARIDE IRON COMPLEX 150 MG PO CAPS
150.0000 mg | ORAL_CAPSULE | ORAL | Status: DC
  Administered 2024-02-27 – 2024-02-29 (×2): 150 mg via ORAL
  Filled 2024-02-27 (×3): qty 1

## 2024-02-27 MED ORDER — LIDOCAINE HCL (PF) 1 % IJ SOLN
30.0000 mL | INTRAMUSCULAR | Status: DC | PRN
Start: 2024-02-27 — End: 2024-02-27

## 2024-02-27 MED ORDER — FLEET ENEMA RE ENEM
1.0000 | ENEMA | RECTAL | Status: DC | PRN
Start: 2024-02-27 — End: 2024-02-27

## 2024-02-27 MED ORDER — OXYCODONE HCL 5 MG PO TABS
5.0000 mg | ORAL_TABLET | ORAL | Status: DC | PRN
  Administered 2024-02-29: 5 mg via ORAL
  Filled 2024-02-27: qty 1

## 2024-02-27 MED ORDER — OXYTOCIN BOLUS FROM INFUSION
333.0000 mL | Freq: Once | INTRAVENOUS | Status: AC
  Administered 2024-02-27: 333 mL via INTRAVENOUS

## 2024-02-27 MED ORDER — FENTANYL-BUPIVACAINE-NACL 0.5-0.125-0.9 MG/250ML-% EP SOLN
EPIDURAL | Status: AC
Start: 2024-02-27 — End: 2024-02-27
  Filled 2024-02-27: qty 250

## 2024-02-27 MED ORDER — ACETAMINOPHEN 325 MG PO TABS: 650.0000 mg | ORAL_TABLET | ORAL | Status: DC | PRN

## 2024-02-27 MED ORDER — PHENYLEPHRINE 80 MCG/ML (10ML) SYRINGE FOR IV PUSH (FOR BLOOD PRESSURE SUPPORT): 80.0000 ug | PREFILLED_SYRINGE | INTRAVENOUS | Status: DC | PRN

## 2024-02-27 MED ORDER — WITCH HAZEL-GLYCERIN EX PADS: 1.0000 | MEDICATED_PAD | CUTANEOUS | Status: DC | PRN

## 2024-02-27 MED ORDER — BUPIVACAINE HCL (PF) 0.25 % IJ SOLN
INTRAMUSCULAR | Status: DC | PRN
  Administered 2024-02-27: 1.6 mL via INTRATHECAL

## 2024-02-27 MED ORDER — ONDANSETRON HCL 4 MG/2ML IJ SOLN: 4.0000 mg | Freq: Four times a day (QID) | INTRAMUSCULAR | Status: DC | PRN

## 2024-02-27 MED ORDER — SOD CITRATE-CITRIC ACID 500-334 MG/5ML PO SOLN: 30.0000 mL | ORAL | Status: DC | PRN

## 2024-02-27 MED ORDER — FENTANYL-BUPIVACAINE-NACL 0.5-0.125-0.9 MG/250ML-% EP SOLN
12.0000 mL/h | EPIDURAL | Status: DC | PRN
  Administered 2024-02-27: 12 mL/h via EPIDURAL

## 2024-02-27 MED ORDER — DIPHENHYDRAMINE HCL 25 MG PO CAPS: 25.0000 mg | ORAL_CAPSULE | Freq: Four times a day (QID) | ORAL | Status: DC | PRN

## 2024-02-27 MED ORDER — PENICILLIN G POT IN DEXTROSE 60000 UNIT/ML IV SOLN: 3.0000 10*6.[IU] | INTRAVENOUS | Status: DC

## 2024-02-27 MED ORDER — OXYCODONE HCL 5 MG PO TABS: 10.0000 mg | ORAL_TABLET | ORAL | Status: DC | PRN

## 2024-02-27 MED ORDER — BENZOCAINE-MENTHOL 20-0.5 % EX AERO: 1.0000 | INHALATION_SPRAY | CUTANEOUS | Status: DC | PRN

## 2024-02-27 NOTE — MAU Note (Signed)

## 2024-02-27 NOTE — Lactation Note (Signed)
 This note was copied from a baby's chart. Lactation Consultation Note  Patient Name: Sheila Dudley [id number] Date: 02/27/2024 Age:34 hours P6 , per mom experienced breast feeder only by 2  Reason for consult: Initial assessment;Term;Breastfeeding assistance LC offered to check the diaper and changed a small to med black soft stool. Baby has stooled x 2 and HNV yet.  Mom latched the baby using the cradle position and  it was shallow.  LC offered to assist and mom was receptive.  LC added pillow support and flipped the upper lip and easy down the chin to increase the depth. LC also reminded mom to all the baby to hug her breast by placing one arm under the breast and one on top.  Several swallows noted and per mom comfortable.  LC reminded mom since this is her 6 th child, to empty her bladder prior to breast feeding x 72 hours and she won't cramp or bleed as much .   Maternal Data Has patient been taught Hand Expression?:  (mom expereinced breastfeeder and is familiar with hand express) Does the patient have breastfeeding experience prior to this delivery?: Yes How long did the patient breastfeed?: per mom breast fed last 2 kids 3 years each  Feeding Mother's Current Feeding Choice: Breast Milk  LATCH Score Latch: Grasps breast easily, tongue down, lips flanged, rhythmical sucking.  Audible Swallowing: Spontaneous and intermittent  Type of Nipple: Everted at rest and after stimulation  Comfort (Breast/Nipple): Soft / non-tender  Hold (Positioning): Assistance needed to correctly position infant at breast and maintain latch.  LATCH Score: 9   Lactation Tools Discussed/Used  None as of yet   Interventions Interventions: Breast feeding basics reviewed;Assisted with latch;Skin to skin;Breast compression;Education;LC Services brochure;CDC milk storage guidelines;CDC Guidelines for Breast Pump Cleaning  Discharge Pump: Hands Free;Personal (per mom Medela)  Consult  Status Consult Status: Follow-up Date: 02/28/24 Follow-up type: In-patient    Sheila Dudley 02/27/2024, 5:31 PM

## 2024-02-27 NOTE — Progress Notes (Signed)
 Transferred via wheelchair with infant in arms

## 2024-02-27 NOTE — H&P (Signed)
 Sheila Dudley is a 33 y.o. female presenting for contractions and was found to be 7 cm dilated. [id number] at 39 weeks 4 days EGA, with LMP 05/25/23 EDC 03/01/24 by first trimester U/S, not consistent with LMP.   Prenatal care received at [facility] and has been significant for:  Elevated 1 hr GCT with normal 3 hour GTT. Iron deficiency anemia and on oral iron tabs.  History of one child with congenital heart anomaly with neonatal demise, fetal echo for current pregnancy was normal.  Grand multiparous status.   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    OB History     Gravida  7   Para  5   Term  5   Preterm  0   AB  1   Living  4      SAB  0   IAB  1   Ectopic  0   Multiple  0   Live Births  5        Obstetric Comments  2010/03/17  died at 3mon        Past Medical History:  Diagnosis Date   Anemia    Fibroids    No pertinent past medical history    Postpartum depression    Past Surgical History:  Procedure Laterality Date   NO PAST SURGERIES     TOOTH EXTRACTION     Family History: family history includes Diabetes in her mother; Healthy in her father. Social History:  reports that she has never smoked. She has never used smokeless tobacco. She reports that she does not currently use alcohol. She reports that she does not use drugs.     Maternal Diabetes: No Genetic Screening: Normal Maternal Ultrasounds/Referrals: Normal Fetal Ultrasounds or other Referrals:  Normal fetal echo Maternal Substance Abuse:  No Significant Maternal Medications:  None Significant Maternal Lab Results:  Group B Strep positive Number of Prenatal Visits:greater than 3 verified prenatal visits Maternal Vaccinations:None.  Other Comments:  None  Review of Systems Constitutional: Denies fevers/chills Cardiovascular: Denies chest pain or palpitations Pulmonary: Denies coughing or wheezing Gastrointestinal: Denies nausea, vomiting or  diarrhea Genitourinary: Denies pelvic pain, unusual vaginal bleeding, unusual vaginal discharge, dysuria, urgency or frequency.  Musculoskeletal: Denies muscle or joint aches and pain.  Neurology: Denies abnormal sensations such as tingling or numbness.   History Dilation: 9 Effacement (%): 100 Exam by:: Tonnie Frederick RN currently breastfeeding. EFM: 140 baseline, moderate variability, reactive. TOCO: Contractions q 1 to 4 minutes.  Exam Physical Exam     02/26/2024   11:22 AM 02/26/2024    9:54 AM 02/21/2024   10:26 PM  Vitals with BMI  Systolic 108 108 629  Diastolic 66 74 83  Pulse 95 101 82    Constitutional: She is oriented to person, place, and time. She appears well-developed and well-nourished.  HENT:  Head: Normocephalic and atraumatic.  Neck: Normal range of motion.  Cardiovascular: Normal rate.    Respiratory: Effort normal.   GI: Soft.  Skin: Skin is warm and dry.  Psychiatric: She has a normal mood and affect. Her behavior is normal.   Genitourinary: Gravid uterus, measures appropriate for gestational age.    Prenatal labs: ABO, Rh: --/--/PENDING (06/03 5284) Antibody: PENDING (06/03 0229) Rubella:  Immune RPR:   NR  HBsAg:  Neg  HIV:  Neg  GBS:  Positive   Recent Results (from the past 2160 hours)  CBC     Status: Abnormal  Collection Time: 02/27/24  2:29 AM  Result Value Ref Range   WBC 6.8 4.0 - 10.5 K/uL   RBC 4.29 3.87 - 5.11 MIL/uL   Hemoglobin 9.8 (L) 12.0 - 15.0 g/dL   HCT 84.1 (L) 32.4 - 40.1 %   MCV 73.7 (L) 80.0 - 100.0 fL   MCH 22.8 (L) 26.0 - 34.0 pg   MCHC 31.0 30.0 - 36.0 g/dL   RDW 02.7 (H) 25.3 - 66.4 %   Platelets 305 150 - 400 K/uL   nRBC 0.0 0.0 - 0.2 %    Comment: Performed at [facility], [address], [place], [place] [address]  Type and screen [facility]     Status: None (Preliminary result)   Collection Time: 02/27/24  2:29 AM  Result Value Ref Range   ABO/RH(D) PENDING    Antibody Screen PENDING     Sample Expiration      03/01/2024,2359 Performed at [facility], [address] [place], [place] [address]     Current Outpatient Medications  Medication Instructions   acetaminophen  (TYLENOL ) 650 mg, Every 6 hours PRN   famotidine  (PEPCID ) 20 mg, Oral, 2 times daily   ferrous sulfate  (FERROUSUL) 325 mg, Oral, 3 times daily with meals   prenatal vitamin w/FE, FA (PRENATAL 1 + 1) 27-1 MG TABS 1 tablet, Oral, Daily    No Known Allergies   Assessment/Plan: 33 y/o G5P5004 in active labor,  Admit to Labor and delivery as per orders. Penicillin for GBS prophylaxis. Epidural as requested.    Charlott Converse, MD. 02/27/2024, 3:09 AM

## 2024-02-27 NOTE — Anesthesia Procedure Notes (Signed)
 Epidural Patient location during procedure: OB Start time: 02/27/2024 3:05 AM End time: 02/27/2024 3:15 AM  Staffing Anesthesiologist: Grace Laura, MD Performed: anesthesiologist   Preanesthetic Checklist Completed: patient identified, IV checked, risks and benefits discussed, monitors and equipment checked, pre-op evaluation and timeout performed  Epidural Patient position: sitting Prep: DuraPrep and site prepped and draped Patient monitoring: continuous pulse ox, blood pressure, heart rate and cardiac monitor Approach: midline Location: L3-L4 Injection technique: LOR air  Needle:  Needle type: Tuohy  Needle gauge: 17 G Needle length: 9 cm Needle insertion depth: 5 cm Catheter type: closed end flexible Catheter size: 19 Gauge Catheter at skin depth: 10 cm Test dose: negative  Assessment Sensory level: T8 Events: blood not aspirated, no cerebrospinal fluid, injection not painful, no injection resistance, no paresthesia and negative IV test  Additional Notes Patient identified. Risks/Benefits/Options discussed with patient including but not limited to bleeding, infection, nerve damage, paralysis, failed block, incomplete pain control, headache, blood pressure changes, nausea, vomiting, reactions to medication both or allergic, itching and postpartum back pain. Confirmed with bedside nurse the patient's most recent platelet count. Confirmed with patient that they are not currently taking any anticoagulation, have any bleeding history or any family history of bleeding disorders. Patient expressed understanding and wished to proceed. All questions were answered. Sterile technique was used throughout the entire procedure. Please see nursing notes for vital signs. Test dose was given through epidural catheter and negative prior to continuing to dose epidural or start infusion. Warning signs of high block given to the patient including shortness of breath, tingling/numbness in hands,  complete motor block, or any concerning symptoms with instructions to call for help. Patient was given instructions on fall risk and not to get out of bed. All questions and concerns addressed with instructions to call with any issues or inadequate analgesia.    CSE performed without complication. LOR at 5cm. 25G Pencan inserted into intrathecal space with return of clear CSF. Marcaine  administered intrathecally as per chart. Epidural catheter inserted into epidural space and left at 10cm at skin. Pt tolerated well. VSS.Reason for block:procedure for pain

## 2024-02-27 NOTE — MAU Note (Signed)
.  Sheila Dudley is a 33 y.o. at [redacted]w[redacted]d here in MAU reporting: frequent contractions that are more intense. Denies leaking of fluid or vaginal bleeding. +FM.

## 2024-02-27 NOTE — Anesthesia Preprocedure Evaluation (Addendum)
 Anesthesia Evaluation  Patient identified by MRN, date of birth, ID band Patient awake    Reviewed: Allergy & Precautions, NPO status , Patient's Chart, lab work & pertinent test results  Airway Mallampati: II  TM Distance: >3 FB Neck ROM: Full    Dental no notable dental hx.    Pulmonary neg pulmonary ROS   Pulmonary exam normal breath sounds clear to auscultation       Cardiovascular negative cardio ROS Normal cardiovascular exam Rhythm:Regular Rate:Normal     Neuro/Psych  PSYCHIATRIC DISORDERS  Depression    negative neurological ROS     GI/Hepatic negative GI ROS, Neg liver ROS,,,  Endo/Other  negative endocrine ROS    Renal/GU negative Renal ROS  negative genitourinary   Musculoskeletal negative musculoskeletal ROS (+)    Abdominal   Peds  Hematology  (+) Blood dyscrasia, anemia Lab Results      Component                Value               Date                      WBC                      6.8                 02/27/2024                HGB                      9.8 (L)             02/27/2024                HCT                      31.6 (L)            02/27/2024                MCV                      73.7 (L)            02/27/2024                PLT                      305                 02/27/2024              Anesthesia Other Findings Presents in labor. Cervical exam is 9cm.   Reproductive/Obstetrics (+) Pregnancy                             Anesthesia Physical Anesthesia Plan  ASA: 2  Anesthesia Plan: Combined Spinal and Epidural   Post-op Pain Management:    Induction:   PONV Risk Score and Plan: Treatment may vary due to age or medical condition  Airway Management Planned: Natural Airway  Additional Equipment:   Intra-op Plan:   Post-operative Plan:   Informed Consent: I have reviewed the patients History and Physical, chart, labs and discussed the procedure  including the risks, benefits and alternatives for the proposed anesthesia with the  patient or authorized representative who has indicated his/her understanding and acceptance.       Plan Discussed with: Anesthesiologist  Anesthesia Plan Comments: (Patient identified. Risks, benefits, options discussed with patient including but not limited to bleeding, infection, nerve damage, paralysis, failed block, incomplete pain control, headache, blood pressure changes, nausea, vomiting, reactions to medication, itching, and post partum back pain. Confirmed with bedside nurse the patient's most recent platelet count. Confirmed with the patient that they are not taking any anticoagulation, have any bleeding history or any family history of bleeding disorders. Patient expressed understanding and wishes to proceed. All questions were answered.   Plan for CSE given pt is multiparous with advanced cervical exam )       Anesthesia Quick Evaluation

## 2024-02-27 NOTE — Anesthesia Postprocedure Evaluation (Signed)
 Anesthesia Post Note  Patient: Sheila Dudley  Procedure(s) Performed: AN AD HOC LABOR EPIDURAL     Patient location during evaluation: Mother Baby Anesthesia Type: Epidural Level of consciousness: awake and alert Pain management: pain level controlled Vital Signs Assessment: post-procedure vital signs reviewed and stable Respiratory status: spontaneous breathing, nonlabored ventilation and respiratory function stable Cardiovascular status: stable Postop Assessment: no headache, no backache and epidural receding Anesthetic complications: no   No notable events documented.  Last Vitals:  Vitals:   02/27/24 0734 02/27/24 1159  BP: 111/72 104/75  Pulse: 81 86  Resp: 18 18  Temp: 36.6 C 36.7 C  SpO2: 100% 98%    Last Pain:  Vitals:   02/27/24 1159  TempSrc: Oral  PainSc: 0-No pain   Pain Goal:                   [name]

## 2024-02-28 LAB — BIRTH TISSUE RECOVERY COLLECTION (PLACENTA DONATION)

## 2024-02-28 LAB — CBC
HCT: 28.8 % — ABNORMAL LOW (ref 36.0–46.0)
Hemoglobin: 9.1 g/dL — ABNORMAL LOW (ref 12.0–15.0)
MCH: 23 pg — ABNORMAL LOW (ref 26.0–34.0)
MCHC: 31.6 g/dL (ref 30.0–36.0)
MCV: 72.7 fL — ABNORMAL LOW (ref 80.0–100.0)
Platelets: 278 10*3/uL (ref 150–400)
RBC: 3.96 MIL/uL (ref 3.87–5.11)
RDW: 15.9 % — ABNORMAL HIGH (ref 11.5–15.5)
WBC: 7.1 10*3/uL (ref 4.0–10.5)
nRBC: 0 % (ref 0.0–0.2)

## 2024-02-28 NOTE — Social Work (Signed)
 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 Per chart review MOB had negative PPD screen for her last baby in 2017. No MH concerns noted in recent OB records. [place]=2 Please contact the Clinical Social Worker if needs arise or by MOB request.  Haroldine Likens, LCSWA Clinical Social Worker [phone]

## 2024-02-28 NOTE — Progress Notes (Signed)
 Post Partum Day 1 Subjective: no complaints, up ad lib, voiding, tolerating PO, + flatus, and +BM.  Objective: Blood pressure 119/83, pulse 100, temperature 98.1 F (36.7 C), temperature source Oral, resp. rate 18, SpO2 100%, unknown if currently breastfeeding.  Physical Exam:  General: alert, cooperative, and no distress Lochia: appropriate Uterine Fundus: FF, NT Incision: n/a DVT Evaluation: no calf tenderness  Recent Labs    02/27/24 0229 02/28/24 0540  HGB 9.8* 9.1*  HCT 31.6* 28.8*    Assessment/Plan: Plan for discharge tomorrow, Breastfeeding, and Circumcision prior to discharge Mild anemia - iron every other day Cont routine PP care   LOS: 1 day   Madelene Schanz, MD 02/28/2024, 4:49 PM

## 2024-02-29 MED ORDER — ACETAMINOPHEN 325 MG PO TABS: 650.0000 mg | ORAL_TABLET | Freq: Four times a day (QID) | ORAL | 1 refills | Status: AC | PRN

## 2024-02-29 MED ORDER — IBUPROFEN 600 MG PO TABS: 600.0000 mg | ORAL_TABLET | Freq: Four times a day (QID) | ORAL | 1 refills | Status: AC | PRN

## 2024-02-29 MED ORDER — FERROUS SULFATE 325 (65 FE) MG PO TABS: 325.0000 mg | ORAL_TABLET | ORAL | 1 refills | Status: AC

## 2024-02-29 NOTE — Lactation Note (Signed)
 This note was copied from a baby's chart. Lactation Consultation Note  Patient Name: Sheila Dudley [id number] Date: 02/29/2024 Age:34 hours Reason for consult: Follow-up assessment;Infant weight loss;Term (4 % weight loss, per mom milk feels like its coming in.) Per mom the baby recently fed 15 mins.  LC updated the doc flow sheets with the last 2 feedings. Latch score range 8-9 .  LC reviewed breast feeding D/C teaching and engorgement prevention and tx.  Per mom breast feeding is going well.  Mom confirmed the output.   Maternal Data Has patient been taught Hand Expression?: Yes Does the patient have breastfeeding experience prior to this delivery?: Yes  Feeding Mother's Current Feeding Choice: Breast Milk  LATCH Score - 9    Lactation Tools Discussed/Used Pump Education: Milk Storage  Interventions Interventions: Breast feeding basics reviewed;Education;CDC milk storage guidelines;CDC Guidelines for Breast Pump Cleaning  Discharge Discharge Education: Engorgement and breast care;Warning signs for feeding baby Pump: Hands Free;Personal  Consult Status Consult Status: Complete Date: 02/29/24    Richarda Chance 02/29/2024, 10:38 AM

## 2024-02-29 NOTE — Discharge Summary (Signed)
 Postpartum Discharge Summary  Date of Service updated 02-29-24     Patient Name: Sheila Dudley DOB: 01/28/1991 MRN: [id number]  Date of admission: 02/27/2024 Delivery date:02/27/2024 Delivering provider: Vernal Gold Date of discharge: 02/29/2024  Admitting diagnosis: Normal labor and delivery [O80] Intrauterine pregnancy: [redacted]w[redacted]d     Secondary diagnosis:  Principal Problem:   Normal labor and delivery  Additional problems: none    Discharge diagnosis: Term Pregnancy Delivered                                              Post partum procedures:none Augmentation: N/A Complications: None  Hospital course: Onset of Labor With Vaginal Delivery      33 y.o. yo [id number] at [redacted]w[redacted]d was admitted in Active Labor on 02/27/2024. Labor course was complicated by nothing  Membrane Rupture Time/Date: 4:04 AM,02/27/2024  Delivery Method:Vaginal, Spontaneous Operative Delivery:N/A Episiotomy: None Lacerations:  None Patient had a postpartum course complicated by nothing.  She is ambulating, tolerating a regular diet, passing flatus, and urinating well. Patient is discharged home in stable condition on 02/29/24.  Newborn Data: Birth date:02/27/2024 Birth time:4:24 AM Gender:Female Living status:Living Apgars:8 ,9  Weight:3820 g  Magnesium Sulfate received: No BMZ received: No  Immunizations administered: Immunization History  Administered Date(s) Administered   Tdap 05/23/2011    Physical exam  Vitals:   02/28/24 0419 02/28/24 1501 02/28/24 2003 02/29/24 0539  BP: 107/77 119/83 120/82 111/83  Pulse: 95 100 80 86  Resp: 16 18 18 16   Temp: 98.6 F (37 C) 98.1 F (36.7 C) 98.5 F (36.9 C) 98.8 F (37.1 C)  TempSrc: Oral Oral Oral Oral  SpO2: 99% 100%  99%   General: alert, cooperative, and no distress Lochia: appropriate Uterine Fundus: FF, NT Incision: N/A DVT Evaluation: no calf tenderness Labs: Lab Results  Component Value Date   WBC 7.1 02/28/2024   HGB 9.1 (L) 02/28/2024    HCT 28.8 (L) 02/28/2024   MCV 72.7 (L) 02/28/2024   PLT 278 02/28/2024      Latest Ref Rng & Units 10/15/2009   12:41 PM  CMP  Glucose 70 - 99 mg/dL 83   BUN 6 - 23 mg/dL 8   Creatinine 0.4 - 1.2 mg/dL 4.69   Sodium 629 - 528 mEq/L 133   Potassium 3.5 - 5.1 mEq/L 4.0   Chloride 96 - 112 mEq/L 102   CO2 19 - 32 mEq/L 24   Calcium 8.4 - 10.5 mg/dL 9.1   Total Protein 6.0 - 8.3 g/dL 7.9   Total Bilirubin 0.3 - 1.2 mg/dL 0.5   Alkaline Phos 39 - 117 U/L 54   AST 0 - 37 U/L 17   ALT 0 - 35 U/L 13    Edinburgh Score:    02/28/2024    4:21 AM  Edinburgh Postnatal Depression Scale Screening Tool  I have been able to laugh and see the funny side of things. 0  I have looked forward with enjoyment to things. 0  I have blamed myself unnecessarily when things went wrong. 1  I have been anxious or worried for no good reason. 0  I have felt scared or panicky for no good reason. 0  Things have been getting on top of me. 1  I have been so unhappy that I have had difficulty sleeping. 0  I have felt  sad or miserable. 0  I have been so unhappy that I have been crying. 0  The thought of harming myself has occurred to me. 0  Edinburgh Postnatal Depression Scale Total 2      After visit meds:  Allergies as of 02/29/2024   No Known Allergies      Medication List     TAKE these medications    acetaminophen  325 MG tablet Commonly known as: Tylenol  Take 2 tablets (650 mg total) by mouth every 6 (six) hours as needed for mild pain (pain score 1-3) or moderate pain (pain score 4-6) (for pain scale < 4). What changed: reasons to take this   famotidine  20 MG tablet Commonly known as: Pepcid  Take 1 tablet (20 mg total) by mouth 2 (two) times daily.   ferrous sulfate  325 (65 FE) MG tablet Commonly known as: FerrouSul Take 1 tablet (325 mg total) by mouth every other day. What changed: when to take this   ibuprofen  600 MG tablet Commonly known as: ADVIL  Take 1 tablet (600 mg total) by  mouth every 6 (six) hours as needed for moderate pain (pain score 4-6), cramping or mild pain (pain score 1-3).   prenatal vitamin w/FE, FA 27-1 MG Tabs tablet Take 1 tablet by mouth daily.         Discharge home in stable condition Infant Feeding: breast feeding Infant Disposition:home with mother Discharge instruction: per After Visit Summary and Postpartum booklet. Activity: Advance as tolerated. Pelvic rest for 6 weeks.  Diet: routine diet Anticipated Birth Control: Unsure Postpartum Appointment:6 weeks Additional Postpartum F/U: n/a Future Appointments:No future appointments. Follow up Visit:  Follow-up Information     [facility], [facility]. Schedule an appointment as soon as possible for a visit in 6 week(s).   Specialty: Obstetrics and Gynecology Why: For Postpartum follow-up Contact information: [address] [place] [address] [phone]                     02/29/2024 Madelene Schanz, MD

## 2024-02-29 NOTE — Lactation Note (Signed)
 This note was copied from a baby's chart. Lactation Consultation Note  Patient Name: Sheila Dudley [id number] Date: 02/29/2024 Age:34 hours Reason for consult: Follow-up assessment (Infant with weight loss -4.42%) MOB had infant latched on her left breast using a side lying position, infant had  been feeding for 10 minutes and still breastfeeding as LC left the room. Per MOB, infant breastfeeding for 15 to minutes most feedings. LC discussed if infant is still cuing after breastfeeding on the 1st breast to offer the 2nd breast during the same feeding. Infant had 3 voids and 2 stools on Day 2 of life. MOB does not have any BF concerns for LC at this time. MOB will continue to breastfeed infant by cues, on demand, 8+ times within 24 hours.   Maternal Data Has patient been taught Hand Expression?: Yes  Feeding Mother's Current Feeding Choice: Breast Milk  LATCH Score Latch: Grasps breast easily, tongue down, lips flanged, rhythmical sucking.  Audible Swallowing: A few with stimulation  Type of Nipple: Everted at rest and after stimulation  Comfort (Breast/Nipple): Soft / non-tender  Hold (Positioning): No assistance needed to correctly position infant at breast.  LATCH Score: 9   Lactation Tools Discussed/Used    Interventions Interventions: Skin to skin;Breast compression;Education  Discharge    Consult Status Consult Status: Follow-up Date: 03/01/24 Follow-up type: In-patient    [name] 02/29/2024, 12:28 AM

## 2024-03-09 ENCOUNTER — Telehealth (HOSPITAL_COMMUNITY): Payer: Self-pay

## 2024-03-09 NOTE — Telephone Encounter (Signed)
 03/09/2024 1429   Name: Sheila Dudley MRN: [id number] DOB: 1991-03-05  Reason for Call:  Transition of Care Hospital Discharge Call  Contact Status: Patient Contact Status: Message  Language assistant needed:          Follow-Up Questions:    [name] Postnatal Depression Scale:  In the Past 7 Days:    PHQ2-9 Depression Scale:     Discharge Follow-up:    Post-discharge interventions: NA  Signature  Wadell Guild
